# Patient Record
Sex: Male | Born: 1940
Health system: Southern US, Community
[De-identification: ages and names within clinical notes are randomized; demographics above are authoritative.]

## PROBLEM LIST (undated history)

## (undated) DIAGNOSIS — I10 Essential (primary) hypertension: Secondary | ICD-10-CM

## (undated) DIAGNOSIS — J449 Chronic obstructive pulmonary disease, unspecified: Secondary | ICD-10-CM

## (undated) DIAGNOSIS — E785 Hyperlipidemia, unspecified: Secondary | ICD-10-CM

## (undated) HISTORY — DX: Essential (primary) hypertension: I10

## (undated) HISTORY — DX: Chronic obstructive pulmonary disease, unspecified: J44.9

## (undated) HISTORY — DX: Hyperlipidemia, unspecified: E78.5

---

## 2013-01-12 ENCOUNTER — Other Ambulatory Visit: Payer: Self-pay | Admitting: Nurse Practitioner

## 2013-01-12 ENCOUNTER — Other Ambulatory Visit: Payer: Self-pay | Admitting: *Deleted

## 2013-01-12 MED ORDER — FLUTICASONE-SALMETEROL 100-50 MCG/DOSE IN AEPB: 1.0000 | INHALATION_SPRAY | Freq: Two times a day (BID) | RESPIRATORY_TRACT | Status: DC

## 2013-07-04 ENCOUNTER — Other Ambulatory Visit: Payer: Self-pay | Admitting: Nurse Practitioner

## 2013-07-06 NOTE — Telephone Encounter (Signed)
Last seen 06/10/12  DFS

## 2013-07-06 NOTE — Telephone Encounter (Signed)
This patient needs to make an appointment to come in and be seen as it has been over a year since he was here. You may refill the prescription x1

## 2013-08-15 ENCOUNTER — Encounter (INDEPENDENT_AMBULATORY_CARE_PROVIDER_SITE_OTHER): Payer: Self-pay

## 2013-08-15 ENCOUNTER — Ambulatory Visit (INDEPENDENT_AMBULATORY_CARE_PROVIDER_SITE_OTHER): Payer: Medicare Other

## 2013-08-15 DIAGNOSIS — Z23 Encounter for immunization: Secondary | ICD-10-CM

## 2013-08-22 ENCOUNTER — Other Ambulatory Visit: Payer: Self-pay | Admitting: Family Medicine

## 2013-08-23 ENCOUNTER — Ambulatory Visit: Payer: Self-pay

## 2013-08-24 NOTE — Telephone Encounter (Signed)
NTBS.

## 2013-08-24 NOTE — Telephone Encounter (Signed)
LAST OV 8/13. NTBS

## 2013-09-22 ENCOUNTER — Other Ambulatory Visit: Payer: Self-pay | Admitting: Nurse Practitioner

## 2013-11-16 ENCOUNTER — Other Ambulatory Visit: Payer: Self-pay | Admitting: Family Medicine

## 2013-11-23 ENCOUNTER — Other Ambulatory Visit: Payer: Self-pay | Admitting: Family Medicine

## 2013-11-25 NOTE — Telephone Encounter (Signed)
Patient notified at last refill that NTBS. Please advise 

## 2013-12-05 ENCOUNTER — Other Ambulatory Visit: Payer: Self-pay | Admitting: Nurse Practitioner

## 2014-01-06 ENCOUNTER — Telehealth: Payer: Self-pay | Admitting: Family Medicine

## 2014-01-06 NOTE — Telephone Encounter (Signed)
appt given for Monday with Neil Smith 

## 2014-01-09 ENCOUNTER — Encounter: Payer: Self-pay | Admitting: Nurse Practitioner

## 2014-01-09 ENCOUNTER — Ambulatory Visit (INDEPENDENT_AMBULATORY_CARE_PROVIDER_SITE_OTHER): Payer: Medicare HMO

## 2014-01-09 ENCOUNTER — Ambulatory Visit (INDEPENDENT_AMBULATORY_CARE_PROVIDER_SITE_OTHER): Payer: Medicare HMO | Admitting: Nurse Practitioner

## 2014-01-09 VITALS — BP 182/91 | HR 98 | Temp 96.3°F | Ht 72.0 in | Wt 251.2 lb

## 2014-01-09 DIAGNOSIS — Z125 Encounter for screening for malignant neoplasm of prostate: Secondary | ICD-10-CM

## 2014-01-09 DIAGNOSIS — J449 Chronic obstructive pulmonary disease, unspecified: Secondary | ICD-10-CM

## 2014-01-09 DIAGNOSIS — IMO0001 Reserved for inherently not codable concepts without codable children: Secondary | ICD-10-CM

## 2014-01-09 DIAGNOSIS — R0602 Shortness of breath: Secondary | ICD-10-CM

## 2014-01-09 DIAGNOSIS — I10 Essential (primary) hypertension: Secondary | ICD-10-CM | POA: Insufficient documentation

## 2014-01-09 MED ORDER — FLUTICASONE-SALMETEROL 100-50 MCG/DOSE IN AEPB: INHALATION_SPRAY | RESPIRATORY_TRACT | Status: DC

## 2014-01-09 MED ORDER — LISINOPRIL 40 MG PO TABS: 40.0000 mg | ORAL_TABLET | Freq: Every day | ORAL | Status: DC

## 2014-01-09 NOTE — Patient Instructions (Signed)

## 2014-01-09 NOTE — Progress Notes (Signed)
Subjective:    Patient ID: Neil Smith, male    DOB: December 09, 1940, 73 y.o.   MRN: [id number]  HPI  Patient in today with history of COPD- He has not been seen in over a year for follow up. Only medicine he is currently on is advair. He has been getting refills- He is doing quite well- Has not has any recent exacerbations. His blood pressure is elevated today but he is not on any meds for it.    Review of Systems  Constitutional: Negative.   Respiratory: Positive for shortness of breath and wheezing.   Cardiovascular: Negative.   Genitourinary: Negative.   Neurological: Negative.  Negative for dizziness, light-headedness and headaches.  Hematological: Negative.   Psychiatric/Behavioral: Negative.   All other systems reviewed and are negative.       Objective:   Physical Exam  Constitutional: He is oriented to person, place, and time. He appears well-developed and well-nourished.  HENT:  Head: Normocephalic.  Right Ear: External ear normal.  Left Ear: External ear normal.  Nose: Nose normal.  Mouth/Throat: Oropharynx is clear and moist.  Eyes: EOM are normal. Pupils are equal, round, and reactive to light.  Neck: Normal range of motion. Neck supple. No JVD present. No thyromegaly present.  Cardiovascular: Normal rate, regular rhythm, normal heart sounds and intact distal pulses.  Exam reveals no gallop and no friction rub.   No murmur heard. Pulmonary/Chest: Effort normal and breath sounds normal. No respiratory distress. He has no wheezes. He has no rales. He exhibits no tenderness.  Abdominal: Soft. Bowel sounds are normal. He exhibits no mass. There is no tenderness.  Musculoskeletal: Normal range of motion. He exhibits no edema.  Lymphadenopathy:    He has no cervical adenopathy.  Neurological: He is alert and oriented to person, place, and time. No cranial nerve deficit.  Skin: Skin is warm and dry.  Psychiatric: He has a normal mood and affect. His behavior is normal.  Judgment and thought content normal.    BP 182/91  Pulse 98  Temp(Src) 96.3 F (35.7 C) (Oral)  Ht 6' (1.829 m)  Wt 251 lb 3.2 oz (113.944 kg)  BMI 34.06 kg/m2  Chest x ray- Chronic bronchitic changes-Preliminary reading by Ronnald Collum, FNP  [facility]  EKG- NSR with BBB-Preliminary reading by Ronnald Collum, FNP  [facility]      Assessment & Plan:   1. COPD bronchitis   2. Hypertension   3. Prostate cancer screening   4. SOB (shortness of breath)    Orders Placed This Encounter  Procedures  . DG Chest 2 View    Standing Status: Future     Number of Occurrences: 1     Standing Expiration Date: 03/11/2015    Order Specific Question:  Reason for Exam (SYMPTOM  OR DIAGNOSIS REQUIRED)    Answer:  former smoker    Order Specific Question:  Preferred imaging location?    Answer:  Internal  . CMP14+EGFR  . NMR, lipoprofile  . PSA, total and free  . EKG 12-Lead  . EKG 12-Lead   Meds ordered this encounter  Medications  . lisinopril (PRINIVIL,ZESTRIL) 40 MG tablet    Sig: Take 1 tablet (40 mg total) by mouth daily.    Dispense:  90 tablet    Refill:  1    Order Specific Question:  Supervising Provider    Answer:  Chipper Herb [[id number]]  . Fluticasone-Salmeterol (ADVAIR DISKUS) 100-50 MCG/DOSE AEPB    Sig: USE 1  PUFF INTO THE LUNG 2 TIMES A DAY    Dispense:  60 each    Refill:  5    NTBS    Order Specific Question:  Supervising Provider    Answer:  Chipper Herb [place] pending Health maintenance reviewed Diet and exercise encouraged Continue all meds Follow up  In 6 months   [place], FNP

## 2014-01-10 LAB — CMP14+EGFR
ALT: 30 IU/L (ref 0–44)
AST: 19 IU/L (ref 0–40)
Albumin/Globulin Ratio: 2.2 (ref 1.1–2.5)
Albumin: 4.8 g/dL (ref 3.5–4.8)
Alkaline Phosphatase: 47 IU/L (ref 39–117)
BUN/Creatinine Ratio: 13 (ref 10–22)
BUN: 12 mg/dL (ref 8–27)
CALCIUM: 9.6 mg/dL (ref 8.6–10.2)
CO2: 22 mmol/L (ref 18–29)
CREATININE: 0.91 mg/dL (ref 0.76–1.27)
Chloride: 101 mmol/L (ref 97–108)
GFR calc Af Amer: 97 mL/min/{1.73_m2} (ref >59)
GFR calc non Af Amer: 84 mL/min/{1.73_m2} (ref >59)
GLOBULIN, TOTAL: 2.2 g/dL (ref 1.5–4.5)
GLUCOSE: 99 mg/dL (ref 65–99)
Potassium: 4.8 mmol/L (ref 3.5–5.2)
SODIUM: 137 mmol/L (ref 134–144)
TOTAL PROTEIN: 7 g/dL (ref 6.0–8.5)
Total Bilirubin: 0.5 mg/dL (ref 0.0–1.2)

## 2014-01-10 LAB — NMR, LIPOPROFILE
Cholesterol: 183 mg/dL (ref <200)
HDL CHOLESTEROL BY NMR: 36 mg/dL — AB (ref >=40)
HDL PARTICLE NUMBER: 30.6 umol/L (ref >=30.5)
LDL Particle Number: 1938 nmol/L — ABNORMAL HIGH (ref <1000)
LDL Size: 19.6 nm — ABNORMAL LOW (ref >20.5)
LDLC SERPL CALC-MCNC: 93 mg/dL (ref <100)
LP-IR Score: 95 — ABNORMAL HIGH (ref <=45)
Small LDL Particle Number: 1601 nmol/L — ABNORMAL HIGH (ref <=527)
Triglycerides by NMR: 268 mg/dL — ABNORMAL HIGH (ref <150)

## 2014-01-10 LAB — PSA, TOTAL AND FREE
PSA, Free Pct: 25.7 %
PSA, Free: 0.18 ng/mL
PSA: 0.7 ng/mL (ref 0.0–4.0)

## 2014-01-11 ENCOUNTER — Other Ambulatory Visit: Payer: Self-pay | Admitting: Nurse Practitioner

## 2014-01-11 MED ORDER — ATORVASTATIN CALCIUM 20 MG PO TABS: 20.0000 mg | ORAL_TABLET | Freq: Every day | ORAL | Status: DC

## 2014-01-30 ENCOUNTER — Ambulatory Visit (INDEPENDENT_AMBULATORY_CARE_PROVIDER_SITE_OTHER): Payer: Medicare HMO | Admitting: Nurse Practitioner

## 2014-01-30 VITALS — BP 149/74 | HR 94

## 2014-01-30 DIAGNOSIS — I10 Essential (primary) hypertension: Secondary | ICD-10-CM

## 2014-01-30 NOTE — Progress Notes (Signed)
   Subjective:    Patient ID: Neil Smith, male    DOB: 09-05-1941, 73 y.o.   MRN: [id number]  HPI    Review of Systems     Objective:   Physical Exam        Assessment & Plan:  Nurse only visit to check blood pressure

## 2014-01-30 NOTE — Patient Instructions (Signed)

## 2014-02-27 ENCOUNTER — Encounter: Payer: Self-pay | Admitting: *Deleted

## 2014-07-01 ENCOUNTER — Other Ambulatory Visit: Payer: Self-pay | Admitting: Nurse Practitioner

## 2014-07-03 ENCOUNTER — Other Ambulatory Visit: Payer: Self-pay | Admitting: Nurse Practitioner

## 2014-07-13 ENCOUNTER — Ambulatory Visit: Payer: Medicare HMO | Admitting: Nurse Practitioner

## 2014-07-14 ENCOUNTER — Ambulatory Visit: Payer: Medicare HMO | Admitting: Nurse Practitioner

## 2014-07-18 ENCOUNTER — Ambulatory Visit (INDEPENDENT_AMBULATORY_CARE_PROVIDER_SITE_OTHER): Payer: Medicare HMO | Admitting: Nurse Practitioner

## 2014-07-18 ENCOUNTER — Encounter: Payer: Self-pay | Admitting: Nurse Practitioner

## 2014-07-18 VITALS — BP 163/73 | HR 81 | Temp 97.8°F | Ht 72.0 in | Wt 250.4 lb

## 2014-07-18 DIAGNOSIS — E785 Hyperlipidemia, unspecified: Secondary | ICD-10-CM

## 2014-07-18 DIAGNOSIS — I10 Essential (primary) hypertension: Secondary | ICD-10-CM

## 2014-07-18 DIAGNOSIS — G629 Polyneuropathy, unspecified: Secondary | ICD-10-CM | POA: Insufficient documentation

## 2014-07-18 DIAGNOSIS — IMO0001 Reserved for inherently not codable concepts without codable children: Secondary | ICD-10-CM

## 2014-07-18 DIAGNOSIS — G589 Mononeuropathy, unspecified: Secondary | ICD-10-CM

## 2014-07-18 DIAGNOSIS — J449 Chronic obstructive pulmonary disease, unspecified: Secondary | ICD-10-CM

## 2014-07-18 MED ORDER — LISINOPRIL 40 MG PO TABS: ORAL_TABLET | ORAL | Status: DC

## 2014-07-18 MED ORDER — GABAPENTIN 300 MG PO CAPS: 300.0000 mg | ORAL_CAPSULE | Freq: Every day | ORAL | Status: DC

## 2014-07-18 NOTE — Progress Notes (Signed)
   Subjective:    Patient ID: Neil Smith, male    DOB: 04-15-1941, 73 y.o.   MRN: [id number]  Patient is here for chronic disease follow up.   Hypertension This is a chronic problem. The current episode started more than 1 year ago. The problem has been gradually improving since onset. The problem is controlled. Associated symptoms include shortness of breath. Risk factors for coronary artery disease include male gender and obesity. The current treatment provides mild improvement. There are no compliance problems.   Hyperlipidemia This is a chronic problem. The problem is controlled. Factors aggravating his hyperlipidemia include thiazides. Associated symptoms include shortness of breath. Current antihyperlipidemic treatment includes statins. The current treatment provides mild improvement of lipids. There are no compliance problems.  Risk factors for coronary artery disease include hypertension and male sex.   COPD:  Currently on advair and reported he is doing well.   *Patient complaints of burning on the sole of his feet during the night. Microfilament test was done and was positive on all location.   Review of Systems  Constitutional: Negative.   HENT: Negative.   Eyes: Negative.   Respiratory: Positive for shortness of breath.   Cardiovascular: Negative.   Gastrointestinal: Negative.   Endocrine: Negative.   Genitourinary: Negative.   Musculoskeletal: Negative.        C/O burning on the sole of his feet at night.   Skin: Negative.   Allergic/Immunologic: Negative.   Neurological: Negative.   Hematological: Negative.   Psychiatric/Behavioral: Negative.        Objective:   Physical Exam  Constitutional: He is oriented to person, place, and time. He appears well-developed and well-nourished.  HENT:  Head: Normocephalic.  Eyes: Pupils are equal, round, and reactive to light.  Neck: Normal range of motion.  Cardiovascular: Normal rate and regular rhythm.     Pulmonary/Chest: Effort normal and breath sounds normal.  Abdominal: Soft.  Musculoskeletal: Normal range of motion.  Neurological: He is alert and oriented to person, place, and time. He has normal reflexes.  Skin: Skin is warm.  Psychiatric: He has a normal mood and affect. His behavior is normal.   BP 163/73  Pulse 81  Temp(Src) 97.8 F (36.6 C) (Oral)  Ht 6' (1.829 m)  Wt 250 lb 6.4 oz (113.581 kg)  BMI 33.95 kg/m2        Assessment & Plan:   1. Essential hypertension - CMP14+EGFR - lisinopril (PRINIVIL,ZESTRIL) 40 MG tablet; TAKE 1 TABLET (40 MG TOTAL) BY MOUTH DAILY.  Dispense: 90 tablet; Refill: 1  2. COPD bronchitis Currently taking advair.   3. Other and unspecified hyperlipidemia - NMR, lipoprofile  4. Neuropathy  - gabapentin (NEURONTIN) 300 MG capsule; Take 1 capsule (300 mg total) by mouth at bedtime.  Dispense: 90 capsule; Refill: 1    Labs pending Health maintenance reviewed Diet and exercise encouraged Continue all meds Follow up  In 3 months PRN   Mary-Margaret Hassell Done, FNP

## 2014-07-18 NOTE — Patient Instructions (Addendum)
Food Choices to Lower Your Triglycerides  Triglycerides are a type of fat in your blood. High levels of triglycerides can increase the risk of heart disease and stroke. If your triglyceride levels are high, the foods you eat and your eating habits are very important. Choosing the right foods can help lower your triglycerides.  WHAT GENERAL GUIDELINES DO I NEED TO FOLLOW?  Lose weight if you are overweight.   Limit or avoid alcohol.   Fill one half of your plate with vegetables and green salads.   Limit fruit to two servings a day. Choose fruit instead of juice.   Make one fourth of your plate whole grains. Look for the word "whole" as the first word in the ingredient list.  Fill one fourth of your plate with lean protein foods.  Enjoy fatty fish (such as salmon, mackerel, sardines, and tuna) three times a week.   Choose healthy fats.   Limit foods high in starch and sugar.  Eat more home-cooked food and less restaurant, buffet, and fast food.  Limit fried foods.  Cook foods using methods other than frying.  Limit saturated fats.  Check ingredient lists to avoid foods with partially hydrogenated oils (trans fats) in them. WHAT FOODS CAN I EAT?  Grains Whole grains, such as whole wheat or whole grain breads, crackers, cereals, and pasta. Unsweetened oatmeal, bulgur, barley, quinoa, or brown rice. Corn or whole wheat flour tortillas.  Vegetables Fresh or frozen vegetables (raw, steamed, roasted, or grilled). Green salads. Fruits All fresh, canned (in natural juice), or frozen fruits. Meat and Other Protein Products Ground beef (85% or leaner), grass-fed beef, or beef trimmed of fat. Skinless chicken or turkey. Ground chicken or turkey. Pork trimmed of fat. All fish and seafood. Eggs. Dried beans, peas, or lentils. Unsalted nuts or seeds. Unsalted canned or dry beans. Dairy Low-fat dairy products, such as skim or 1% milk, 2% or reduced-fat cheeses, low-fat ricotta or  cottage cheese, or plain low-fat yogurt. Fats and Oils Tub margarines without trans fats. Light or reduced-fat mayonnaise and salad dressings. Avocado. Safflower, olive, or canola oils. Natural peanut or almond butter. The items listed above may not be a complete list of recommended foods or beverages. Contact your dietitian for more options. WHAT FOODS ARE NOT RECOMMENDED?  Grains White bread. White pasta. White rice. Cornbread. Bagels, pastries, and croissants. Crackers that contain trans fat. Vegetables White potatoes. Corn. Creamed or fried vegetables. Vegetables in a cheese sauce. Fruits Dried fruits. Canned fruit in light or heavy syrup. Fruit juice. Meat and Other Protein Products Fatty cuts of meat. Ribs, chicken wings, bacon, sausage, bologna, salami, chitterlings, fatback, hot dogs, bratwurst, and packaged luncheon meats. Dairy Whole or 2% milk, cream, half-and-half, and cream cheese. Whole-fat or sweetened yogurt. Full-fat cheeses. Nondairy creamers and whipped toppings. Processed cheese, cheese spreads, or cheese curds. Sweets and Desserts Corn syrup, sugars, honey, and molasses. Candy. Jam and jelly. Syrup. Sweetened cereals. Cookies, pies, cakes, donuts, muffins, and ice cream. Fats and Oils Butter, stick margarine, lard, shortening, ghee, or bacon fat. Coconut, palm kernel, or palm oils. Beverages Alcohol. Sweetened drinks (such as sodas, lemonade, and fruit drinks or punches). The items listed above may not be a complete list of foods and beverages to avoid. Contact your dietitian for more information. Document Released: 07/31/2004 Document Revised: 10/18/2013 Document Reviewed: 08/17/2013 [employer] Patient Information 2015 [employer]. This information is not intended to replace advice given to you by your health care provider. Make sure you discuss   any questions you have with your health care provider. Peripheral Neuropathy Peripheral neuropathy is a type of nerve  damage. It affects nerves that carry signals between the spinal cord and other parts of the body. These are called peripheral nerves. With peripheral neuropathy, one nerve or a group of nerves may be damaged.  CAUSES  Many things can damage peripheral nerves. For some people with peripheral neuropathy, the cause is unknown. Some causes include:  Diabetes. This is the most common cause of peripheral neuropathy.  Injury to a nerve.  Pressure or stress on a nerve that lasts a long time.  Too little vitamin B. Alcoholism can lead to this.  Infections.  Autoimmune diseases, such as multiple sclerosis and systemic lupus erythematosus.  Inherited nerve diseases.  Some medicines, such as cancer drugs.  Toxic substances, such as lead and mercury.  Too little blood flowing to the legs.  Kidney disease.  Thyroid disease. SIGNS AND SYMPTOMS  Different people have different symptoms. The symptoms you have will depend on which of your nerves is damaged. Common symptoms include:  Loss of feeling (numbness) in the feet and hands.  Tingling in the feet and hands.  Pain that burns.  Very sensitive skin.  Weakness.  Not being able to move a part of the body (paralysis).  Muscle twitching.  Clumsiness or poor coordination.  Loss of balance.  Not being able to control your bladder.  Feeling dizzy.  Sexual problems. DIAGNOSIS  Peripheral neuropathy is a symptom, not a disease. Finding the cause of peripheral neuropathy can be hard. To figure that out, your health care provider will take a medical history and do a physical exam. A neurological exam will also be done. This involves checking things affected by your brain, spinal cord, and nerves (nervous system). For example, your health care provider will check your reflexes, how you move, and what you can feel.  Other types of tests may also be ordered, such as:  Blood tests.  A test of the fluid in your spinal cord.  Imaging  tests, such as CT scans or an MRI.  Electromyography (EMG). This test checks the nerves that control muscles.  Nerve conduction velocity tests. These tests check how fast messages pass through your nerves.  Nerve biopsy. A small piece of nerve is removed. It is then checked under a microscope. TREATMENT   Medicine is often used to treat peripheral neuropathy. Medicines may include:  Pain-relieving medicines. Prescription or over-the-counter medicine may be suggested.  Antiseizure medicine. This may be used for pain.  Antidepressants. These also may help ease pain from neuropathy.  Lidocaine. This is a numbing medicine. You might wear a patch or be given a shot.  Mexiletine. This medicine is typically used to help control irregular heart rhythms.  Surgery. Surgery may be needed to relieve pressure on a nerve or to destroy a nerve that is causing pain.  Physical therapy to help movement.  Assistive devices to help movement. HOME CARE INSTRUCTIONS   Only take over-the-counter or prescription medicines as directed by your health care provider. Follow the instructions carefully for any given medicines. Do not take any other medicines without first getting approval from your health care provider.  If you have diabetes, work closely with your health care provider to keep your blood sugar under control.  If you have numbness in your feet:  Check every day for signs of injury or infection. Watch for redness, warmth, and swelling.  Wear padded socks and comfortable  shoes. These help protect your feet.  Do not do things that put pressure on your damaged nerve.  Do not smoke. Smoking keeps blood from getting to damaged nerves.  Avoid or limit alcohol. Too much alcohol can cause a lack of B vitamins. These vitamins are needed for healthy nerves.  Develop a good support system. Coping with peripheral neuropathy can be stressful. Talk to a mental health specialist or join a support group  if you are struggling.  Follow up with your health care provider as directed. SEEK MEDICAL CARE IF:   You have new signs or symptoms of peripheral neuropathy.  You are struggling emotionally from dealing with peripheral neuropathy.  You have a fever. SEEK IMMEDIATE MEDICAL CARE IF:   You have an injury or infection that is not healing.  You feel very dizzy or begin vomiting.  You have chest pain.  You have trouble breathing. Document Released: 10/03/2002 Document Revised: 06/25/2011 Document Reviewed: 06/20/2013 [employer] Patient Information 2015 [place], [place]. This information is not intended to replace advice given to you by your health care provider. Make sure you discuss any questions you have with your health care provider.

## 2014-07-19 ENCOUNTER — Other Ambulatory Visit: Payer: Medicare HMO

## 2014-07-19 ENCOUNTER — Other Ambulatory Visit: Payer: Self-pay | Admitting: Family Medicine

## 2014-07-19 DIAGNOSIS — I10 Essential (primary) hypertension: Secondary | ICD-10-CM

## 2014-07-19 DIAGNOSIS — Z1212 Encounter for screening for malignant neoplasm of rectum: Secondary | ICD-10-CM

## 2014-07-19 LAB — NMR, LIPOPROFILE
CHOLESTEROL: 125 mg/dL (ref 100–199)
HDL Cholesterol by NMR: 27 mg/dL — ABNORMAL LOW (ref >39)
HDL Particle Number: 27.2 umol/L — ABNORMAL LOW (ref >=30.5)
LDL Particle Number: 977 nmol/L (ref <1000)
LDL Size: 19.7 nm (ref >20.5)
LDLC SERPL CALC-MCNC: 60 mg/dL (ref 0–99)
LP-IR Score: 81 — ABNORMAL HIGH (ref <=45)
Small LDL Particle Number: 753 nmol/L — ABNORMAL HIGH (ref <=527)
TRIGLYCERIDES BY NMR: 192 mg/dL — AB (ref 0–149)

## 2014-07-19 LAB — CMP14+EGFR
ALBUMIN: 4.4 g/dL (ref 3.5–4.8)
ALT: 20 IU/L (ref 0–44)
AST: 14 IU/L (ref 0–40)
Albumin/Globulin Ratio: 1.8 (ref 1.1–2.5)
Alkaline Phosphatase: 59 IU/L (ref 39–117)
BUN/Creatinine Ratio: 9 — ABNORMAL LOW (ref 10–22)
BUN: 11 mg/dL (ref 8–27)
CHLORIDE: 103 mmol/L (ref 97–108)
CO2: 23 mmol/L (ref 18–29)
Calcium: 9.6 mg/dL (ref 8.6–10.2)
Creatinine, Ser: 1.21 mg/dL (ref 0.76–1.27)
GFR calc Af Amer: 68 mL/min/{1.73_m2} (ref >59)
GFR calc non Af Amer: 59 mL/min/{1.73_m2} — ABNORMAL LOW (ref >59)
GLUCOSE: 99 mg/dL (ref 65–99)
Globulin, Total: 2.4 g/dL (ref 1.5–4.5)
Potassium: 4.4 mmol/L (ref 3.5–5.2)
Sodium: 141 mmol/L (ref 134–144)
Total Bilirubin: 0.4 mg/dL (ref 0.0–1.2)
Total Protein: 6.8 g/dL (ref 6.0–8.5)

## 2014-07-19 MED ORDER — LISINOPRIL 40 MG PO TABS: ORAL_TABLET | ORAL | Status: DC

## 2014-07-21 LAB — FECAL OCCULT BLOOD, IMMUNOCHEMICAL: FECAL OCCULT BLD: NEGATIVE

## 2014-07-24 ENCOUNTER — Telehealth: Payer: Self-pay | Admitting: Family Medicine

## 2014-07-24 NOTE — Telephone Encounter (Signed)
Message copied by Azalee Course on Mon Jul 24, 2014 10:06 AM ------      Message from: Bennie Pierini      Created: Fri Jul 21, 2014  3:41 PM       hemocult negative ------

## 2014-07-24 NOTE — Telephone Encounter (Signed)
Patient aware.

## 2014-07-30 ENCOUNTER — Other Ambulatory Visit: Payer: Self-pay | Admitting: Nurse Practitioner

## 2014-09-28 ENCOUNTER — Other Ambulatory Visit: Payer: Self-pay | Admitting: Nurse Practitioner

## 2014-10-23 ENCOUNTER — Ambulatory Visit: Payer: Medicare HMO | Admitting: Nurse Practitioner

## 2014-12-23 ENCOUNTER — Other Ambulatory Visit: Payer: Self-pay | Admitting: Nurse Practitioner

## 2014-12-25 NOTE — Telephone Encounter (Signed)
Gave 1mos refill to make appt for March

## 2014-12-30 ENCOUNTER — Other Ambulatory Visit: Payer: Self-pay | Admitting: Nurse Practitioner

## 2015-01-16 ENCOUNTER — Other Ambulatory Visit: Payer: Self-pay | Admitting: Nurse Practitioner

## 2015-03-23 ENCOUNTER — Other Ambulatory Visit: Payer: Self-pay | Admitting: Nurse Practitioner

## 2015-03-23 NOTE — Telephone Encounter (Signed)
Last seen 07/18/14 MMM  Requesting 90 day supply

## 2015-03-24 NOTE — Telephone Encounter (Signed)
no more refills without being seen  

## 2015-03-29 ENCOUNTER — Other Ambulatory Visit: Payer: Self-pay | Admitting: Nurse Practitioner

## 2015-03-29 NOTE — Telephone Encounter (Signed)
no more refills without being seen  

## 2015-04-01 ENCOUNTER — Other Ambulatory Visit: Payer: Self-pay | Admitting: Nurse Practitioner

## 2015-05-01 ENCOUNTER — Telehealth: Payer: Self-pay | Admitting: Nurse Practitioner

## 2015-05-01 ENCOUNTER — Other Ambulatory Visit: Payer: Self-pay | Admitting: Nurse Practitioner

## 2015-05-01 MED ORDER — ATORVASTATIN CALCIUM 20 MG PO TABS: 20.0000 mg | ORAL_TABLET | Freq: Every day | ORAL | Status: DC

## 2015-05-01 NOTE — Telephone Encounter (Signed)
Med was written for 30 days because patient needs to be seen. Request today was denied for this reason.  Spoke with daughter in law and appt scheduled for 05/09/15. He only has 4 pills left and will need a script to last until then. Med reordered electronically.

## 2015-05-09 ENCOUNTER — Ambulatory Visit (INDEPENDENT_AMBULATORY_CARE_PROVIDER_SITE_OTHER): Payer: Commercial Managed Care - HMO | Admitting: Nurse Practitioner

## 2015-05-09 ENCOUNTER — Encounter: Payer: Self-pay | Admitting: Nurse Practitioner

## 2015-05-09 VITALS — BP 145/63 | HR 99 | Temp 97.1°F | Ht 72.0 in | Wt 251.8 lb

## 2015-05-09 DIAGNOSIS — I10 Essential (primary) hypertension: Secondary | ICD-10-CM | POA: Diagnosis not present

## 2015-05-09 DIAGNOSIS — J449 Chronic obstructive pulmonary disease, unspecified: Secondary | ICD-10-CM

## 2015-05-09 DIAGNOSIS — IMO0001 Reserved for inherently not codable concepts without codable children: Secondary | ICD-10-CM

## 2015-05-09 DIAGNOSIS — Z125 Encounter for screening for malignant neoplasm of prostate: Secondary | ICD-10-CM | POA: Diagnosis not present

## 2015-05-09 DIAGNOSIS — G629 Polyneuropathy, unspecified: Secondary | ICD-10-CM

## 2015-05-09 DIAGNOSIS — Z23 Encounter for immunization: Secondary | ICD-10-CM | POA: Diagnosis not present

## 2015-05-09 DIAGNOSIS — R600 Localized edema: Secondary | ICD-10-CM

## 2015-05-09 DIAGNOSIS — R609 Edema, unspecified: Secondary | ICD-10-CM

## 2015-05-09 DIAGNOSIS — E785 Hyperlipidemia, unspecified: Secondary | ICD-10-CM

## 2015-05-09 DIAGNOSIS — Z6834 Body mass index (BMI) 34.0-34.9, adult: Secondary | ICD-10-CM | POA: Insufficient documentation

## 2015-05-09 MED ORDER — FUROSEMIDE 20 MG PO TABS: 20.0000 mg | ORAL_TABLET | Freq: Every day | ORAL | Status: DC

## 2015-05-09 MED ORDER — LISINOPRIL 40 MG PO TABS: ORAL_TABLET | ORAL | Status: DC

## 2015-05-09 MED ORDER — FLUTICASONE-SALMETEROL 100-50 MCG/DOSE IN AEPB: INHALATION_SPRAY | RESPIRATORY_TRACT | Status: DC

## 2015-05-09 MED ORDER — ATORVASTATIN CALCIUM 20 MG PO TABS: 20.0000 mg | ORAL_TABLET | Freq: Every day | ORAL | Status: DC

## 2015-05-09 MED ORDER — GABAPENTIN 300 MG PO CAPS: ORAL_CAPSULE | ORAL | Status: DC

## 2015-05-09 NOTE — Progress Notes (Signed)
Subjective:    Patient ID: Neil Smith, male    DOB: Aug 12, 1941, 74 y.o.   MRN: [id number]  Patient is here for chronic disease follow up.   Hypertension This is a chronic problem. The current episode started more than 1 year ago. The problem has been gradually improving since onset. The problem is controlled. Associated symptoms include shortness of breath. Risk factors for coronary artery disease include male gender and obesity. Past treatments include ACE inhibitors. The current treatment provides mild improvement. There are no compliance problems.  There is no history of CAD/MI, CVA or retinopathy.  Hyperlipidemia This is a chronic problem. The problem is controlled. Factors aggravating his hyperlipidemia include thiazides. Associated symptoms include shortness of breath. Current antihyperlipidemic treatment includes statins. The current treatment provides mild improvement of lipids. There are no compliance problems.  Risk factors for coronary artery disease include hypertension and male sex.   COPD:  Currently on advair and reported he is doing well.   *Patient complaints of burning on the sole of his feet during the night. Microfilament test was done and was positive on all location.   Review of Systems  Constitutional: Negative.   HENT: Negative.   Eyes: Negative.   Respiratory: Positive for shortness of breath.   Cardiovascular: Negative.   Gastrointestinal: Negative.   Endocrine: Negative.   Genitourinary: Negative.   Musculoskeletal: Negative.        C/O burning on the sole of his feet at night.   Skin: Negative.   Allergic/Immunologic: Negative.   Neurological: Negative.   Hematological: Negative.   Psychiatric/Behavioral: Negative.        Objective:   Physical Exam  Constitutional: He is oriented to person, place, and time. He appears well-developed and well-nourished.  HENT:  Head: Normocephalic.  Eyes: Pupils are equal, round, and reactive to light.  Neck:  Normal range of motion.  Cardiovascular: Normal rate and regular rhythm.   Pulmonary/Chest: Effort normal and breath sounds normal.  Abdominal: Soft.  Musculoskeletal: Normal range of motion. He exhibits edema (1+ edema bil lower ext).  Neurological: He is alert and oriented to person, place, and time. He has normal reflexes.  Skin: Skin is warm.  Psychiatric: He has a normal mood and affect. His behavior is normal.   BP 145/63 mmHg  Pulse 99  Temp(Src) 97.1 F (36.2 C) (Oral)  Ht 6' (1.829 m)  Wt 251 lb 12.8 oz (114.216 kg)  BMI 34.14 kg/m2      Assessment & Plan:   1. Essential hypertension Do not add salt to diet Keep diary of blood sugars at home and call if stays above 275 systolic - lisinopril (PRINIVIL,ZESTRIL) 40 MG tablet; TAKE 1 TABLET (40 MG TOTAL) BY MOUTH DAILY.  Dispense: 90 tablet; Refill: 1 - CMP14+EGFR  2. COPD bronchitis - Fluticasone-Salmeterol (ADVAIR DISKUS) 100-50 MCG/DOSE AEPB; USE 1 PUFF TWICE A DAY  Dispense: 60 each; Refill: 5  3. Neuropathy - gabapentin (NEURONTIN) 300 MG capsule; TAKE 1 CAPSULE (300 MG TOTAL) BY MOUTH AT BEDTIME.  Dispense: 90 capsule; Refill: 5  4. Hyperlipidemia with target LDL less than 100 Low fat diet - atorvastatin (LIPITOR) 20 MG tablet; Take 1 tablet (20 mg total) by mouth daily at 6 PM.  Dispense: 30 tablet; Refill: 5 - Lipid panel  5. BMI 34.0-34.9,adult Discussed diet and exercise for person with BMI >25 Will recheck weight in 3-6 months   6. Peripheral edema Elevate legs when sitting - furosemide (LASIX) 20 MG tablet; Take  1 tablet (20 mg total) by mouth daily.  Dispense: 30 tablet; Refill: 5  7. Prostate cancer screening - PSA Total+%Free (Serial)    Labs pending Health maintenance reviewed Diet and exercise encouraged Continue all meds Follow up  In 3 months   [place], FNP

## 2015-05-09 NOTE — Patient Instructions (Signed)

## 2015-05-09 NOTE — Addendum Note (Signed)
Addended by: Fawn KirkHOLT, CATHY on: 05/09/2015 09:22 AM   Modules accepted: Orders

## 2015-05-09 NOTE — Addendum Note (Signed)
Addended by: Tommas OlpHANDY, Hridhaan Yohn N on: 05/09/2015 09:25 AM   Modules accepted: Orders

## 2015-05-10 ENCOUNTER — Telehealth: Payer: Self-pay | Admitting: *Deleted

## 2015-05-10 LAB — CMP14+EGFR
ALT: 23 IU/L (ref 0–44)
AST: 19 IU/L (ref 0–40)
Albumin/Globulin Ratio: 1.8 (ref 1.1–2.5)
Albumin: 4.2 g/dL (ref 3.5–4.8)
Alkaline Phosphatase: 53 IU/L (ref 39–117)
BILIRUBIN TOTAL: 0.7 mg/dL (ref 0.0–1.2)
BUN / CREAT RATIO: 15 (ref 10–22)
BUN: 16 mg/dL (ref 8–27)
CO2: 22 mmol/L (ref 18–29)
Calcium: 9.2 mg/dL (ref 8.6–10.2)
Chloride: 102 mmol/L (ref 97–108)
Creatinine, Ser: 1.07 mg/dL (ref 0.76–1.27)
GFR calc non Af Amer: 68 mL/min/{1.73_m2} (ref >59)
GFR, EST AFRICAN AMERICAN: 79 mL/min/{1.73_m2} (ref >59)
GLOBULIN, TOTAL: 2.3 g/dL (ref 1.5–4.5)
Glucose: 184 mg/dL — ABNORMAL HIGH (ref 65–99)
Potassium: 4.9 mmol/L (ref 3.5–5.2)
Sodium: 141 mmol/L (ref 134–144)
Total Protein: 6.5 g/dL (ref 6.0–8.5)

## 2015-05-10 LAB — LIPID PANEL
CHOL/HDL RATIO: 4.6 ratio (ref 0.0–5.0)
CHOLESTEROL TOTAL: 129 mg/dL (ref 100–199)
HDL: 28 mg/dL — AB (ref >39)
LDL Calculated: 66 mg/dL (ref 0–99)
Triglycerides: 177 mg/dL — ABNORMAL HIGH (ref 0–149)
VLDL CHOLESTEROL CAL: 35 mg/dL (ref 5–40)

## 2015-05-10 LAB — PSA, TOTAL AND FREE
PROSTATE SPECIFIC AG, SERUM: 0.8 ng/mL (ref 0.0–4.0)
PSA, Free Pct: 22.5 %
PSA, Free: 0.18 ng/mL

## 2015-05-10 NOTE — Telephone Encounter (Signed)
-----   [facility] EdMary-Margaret Martin, FNP sent at 05/10/2015  1:39 PM EDT ----- Kidney and liver function stable Blood sugar elevated- don't think he was fasting- add hgba1c just to make sure Cholesterol looks good PSA normal Continue current meds- low fat diet and exercise and recheck in 3 months

## 2015-05-10 NOTE — Telephone Encounter (Signed)
Pt's son notified of results Verbalizes understanding

## 2015-05-21 ENCOUNTER — Other Ambulatory Visit: Payer: Self-pay | Admitting: Nurse Practitioner

## 2015-08-14 ENCOUNTER — Telehealth: Payer: Self-pay | Admitting: Nurse Practitioner

## 2015-09-27 ENCOUNTER — Other Ambulatory Visit: Payer: Self-pay | Admitting: *Deleted

## 2015-09-27 DIAGNOSIS — R609 Edema, unspecified: Secondary | ICD-10-CM

## 2015-09-27 DIAGNOSIS — E785 Hyperlipidemia, unspecified: Secondary | ICD-10-CM

## 2015-09-27 MED ORDER — FUROSEMIDE 20 MG PO TABS: 20.0000 mg | ORAL_TABLET | Freq: Every day | ORAL | Status: DC

## 2015-09-27 MED ORDER — ATORVASTATIN CALCIUM 20 MG PO TABS: 20.0000 mg | ORAL_TABLET | Freq: Every day | ORAL | Status: DC

## 2015-10-14 ENCOUNTER — Other Ambulatory Visit: Payer: Self-pay | Admitting: Nurse Practitioner

## 2015-12-04 ENCOUNTER — Ambulatory Visit (INDEPENDENT_AMBULATORY_CARE_PROVIDER_SITE_OTHER): Payer: Commercial Managed Care - HMO | Admitting: Nurse Practitioner

## 2015-12-04 ENCOUNTER — Encounter: Payer: Self-pay | Admitting: Nurse Practitioner

## 2015-12-04 VITALS — BP 136/82 | HR 100 | Temp 97.1°F | Ht 72.0 in | Wt 248.0 lb

## 2015-12-04 DIAGNOSIS — R609 Edema, unspecified: Secondary | ICD-10-CM | POA: Diagnosis not present

## 2015-12-04 DIAGNOSIS — E785 Hyperlipidemia, unspecified: Secondary | ICD-10-CM | POA: Diagnosis not present

## 2015-12-04 DIAGNOSIS — I10 Essential (primary) hypertension: Secondary | ICD-10-CM

## 2015-12-04 DIAGNOSIS — G629 Polyneuropathy, unspecified: Secondary | ICD-10-CM

## 2015-12-04 DIAGNOSIS — J449 Chronic obstructive pulmonary disease, unspecified: Secondary | ICD-10-CM

## 2015-12-04 DIAGNOSIS — Z0000 Encounter for general adult medical examination without abnormal findings: Secondary | ICD-10-CM | POA: Diagnosis not present

## 2015-12-04 DIAGNOSIS — Z6833 Body mass index (BMI) 33.0-33.9, adult: Secondary | ICD-10-CM

## 2015-12-04 DIAGNOSIS — Z6834 Body mass index (BMI) 34.0-34.9, adult: Secondary | ICD-10-CM

## 2015-12-04 DIAGNOSIS — IMO0001 Reserved for inherently not codable concepts without codable children: Secondary | ICD-10-CM

## 2015-12-04 MED ORDER — GABAPENTIN 300 MG PO CAPS: ORAL_CAPSULE | ORAL | Status: DC

## 2015-12-04 MED ORDER — FLUTICASONE-SALMETEROL 100-50 MCG/DOSE IN AEPB: INHALATION_SPRAY | RESPIRATORY_TRACT | Status: DC

## 2015-12-04 MED ORDER — FUROSEMIDE 20 MG PO TABS: 20.0000 mg | ORAL_TABLET | Freq: Every day | ORAL | Status: DC

## 2015-12-04 MED ORDER — ATORVASTATIN CALCIUM 20 MG PO TABS: ORAL_TABLET | ORAL | Status: DC

## 2015-12-04 MED ORDER — LISINOPRIL 40 MG PO TABS: ORAL_TABLET | ORAL | Status: DC

## 2015-12-04 NOTE — Progress Notes (Signed)
Subjective:    Patient ID: Neil Smith, male    DOB: 02-06-1941, 75 y.o.   MRN: [id number]  Patient here today for follow up of chronic medical problems.  Outpatient Encounter Prescriptions as of 12/04/2015  Medication Sig  . ADVAIR DISKUS 100-50 MCG/DOSE AEPB USE 1 PUFF TWICE A DAY  . atorvastatin (LIPITOR) 20 MG tablet TAKE 1 TABLET (20 MG TOTAL) BY MOUTH DAILY AT 6 PM.  . Fluticasone-Salmeterol (ADVAIR DISKUS) 100-50 MCG/DOSE AEPB USE 1 PUFF TWICE A DAY  . furosemide (LASIX) 20 MG tablet Take 1 tablet (20 mg total) by mouth daily.  Marland Kitchen gabapentin (NEURONTIN) 300 MG capsule TAKE 1 CAPSULE (300 MG TOTAL) BY MOUTH AT BEDTIME.  Marland Kitchen lisinopril (PRINIVIL,ZESTRIL) 40 MG tablet TAKE 1 TABLET (40 MG TOTAL) BY MOUTH DAILY.  . Magnesium 400 MG CAPS Take by mouth.  . VENTOLIN HFA 108 (90 BASE) MCG/ACT inhaler INHALE 2 PUFFS EVERY 4 TO 6 HOURS AS NEEDED   No facility-administered encounter medications on file as of 12/04/2015.    Hypertension This is a chronic problem. The current episode started more than 1 year ago. The problem has been gradually improving since onset. The problem is controlled. Associated symptoms include shortness of breath. Risk factors for coronary artery disease include male gender and obesity. Past treatments include ACE inhibitors. The current treatment provides mild improvement. There are no compliance problems.  There is no history of CAD/MI, CVA or retinopathy.  Hyperlipidemia This is a chronic problem. The problem is controlled. Factors aggravating his hyperlipidemia include thiazides. Associated symptoms include shortness of breath. Current antihyperlipidemic treatment includes statins. The current treatment provides mild improvement of lipids. There are no compliance problems.  Risk factors for coronary artery disease include hypertension and male sex.  COPD:  Currently on advair and reported he is doing well.  neuropathy neurotin at bedtime- helps with burning  sensation in feet       Review of Systems  Constitutional: Negative.   HENT: Negative.   Eyes: Negative.   Respiratory: Positive for shortness of breath.   Cardiovascular: Negative.   Gastrointestinal: Negative.   Endocrine: Negative.   Genitourinary: Negative.   Musculoskeletal: Negative.        C/O burning on the sole of his feet at night.   Skin: Negative.   Allergic/Immunologic: Negative.   Neurological: Negative.   Hematological: Negative.   Psychiatric/Behavioral: Negative.        Objective:   Physical Exam  Constitutional: He is oriented to person, place, and time. He appears well-developed and well-nourished.  HENT:  Head: Normocephalic.  Eyes: Pupils are equal, round, and reactive to light.  Neck: Normal range of motion.  Cardiovascular: Normal rate and regular rhythm.   Pulmonary/Chest: Effort normal and breath sounds normal.  Abdominal: Soft.  Musculoskeletal: Normal range of motion. He exhibits edema (1+ edema bil lower ext).  Neurological: He is alert and oriented to person, place, and time. He has normal reflexes.  Skin: Skin is warm.  Psychiatric: He has a normal mood and affect. His behavior is normal.   BP 136/82 mmHg  Pulse 100  Temp(Src) 97.1 F (36.2 C) (Oral)  Ht 6' (1.829 m)  Wt 248 lb (112.492 kg)  BMI 33.63 kg/m2       Assessment & Plan:  1. Annual physical exam - Anemia Profile B  2. Essential hypertension Do  not add salt to diet - lisinopril (PRINIVIL,ZESTRIL) 40 MG tablet; TAKE 1 TABLET (40 MG TOTAL) BY MOUTH  DAILY.  Dispense: 30 tablet; Refill: 5 - CMP14+EGFR  3. Hyperlipidemia with target LDL less than 100 Low fat diet - atorvastatin (LIPITOR) 20 MG tablet; TAKE 1 TABLET (20 MG TOTAL) BY MOUTH DAILY AT 6 PM.  Dispense: 30 tablet; Refill: 5 - Lipid panel  4. BMI 34.0-34.9,adult Discussed diet and exercise for person with BMI >25 Will recheck weight in 3-6 months  5. Neuropathy (HCC) - gabapentin (NEURONTIN) 300 MG  capsule; TAKE 1 CAPSULE (300 MG TOTAL) BY MOUTH AT BEDTIME.  Dispense: 90 capsule; Refill: 5  6. COPD bronchitis - Fluticasone-Salmeterol (ADVAIR DISKUS) 100-50 MCG/DOSE AEPB; USE 1 PUFF TWICE A DAY  Dispense: 60 each; Refill: 5  7. BMI 33.0-33.9,adult Discussed diet and exercise for person with BMI >25 Will recheck weight in 3-6 months   8. Peripheral edema Elevate your feet when sitting - furosemide (LASIX) 20 MG tablet; Take 1 tablet (20 mg total) by mouth daily.  Dispense: 90 tablet; Refill: 0    Labs pending Health maintenance reviewed Diet and exercise encouraged Continue all meds Follow up  In 6 month   [place], FNP

## 2015-12-04 NOTE — Addendum Note (Signed)
Addended by: Prescott Gum on: 12/04/2015 03:37 PM   Modules accepted: Kipp Brood

## 2015-12-04 NOTE — Patient Instructions (Signed)
Exercising to Stay Healthy Exercising regularly is important. It has many health benefits, such as:  Improving your overall fitness, flexibility, and endurance.  Increasing your bone density.  Helping with weight control.  Decreasing your body fat.  Increasing your muscle strength.  Reducing stress and tension.  Improving your overall health. In order to become healthy and stay healthy, it is recommended that you do moderate-intensity and vigorous-intensity exercise. You can tell that you are exercising at a moderate intensity if you have a higher heart rate and faster breathing, but you are still able to hold a conversation. You can tell that you are exercising at a vigorous intensity if you are breathing much harder and faster and cannot hold a conversation while exercising. HOW OFTEN SHOULD I EXERCISE? Choose an activity that you enjoy and set realistic goals. Your health care provider can help you to make an activity plan that works for you. Exercise regularly as directed by your health care provider. This may include:   Doing resistance training twice each week, such as:  Push-ups.  Sit-ups.  Lifting weights.  Using resistance bands.  Doing a given intensity of exercise for a given amount of time. Choose from these options:  150 minutes of moderate-intensity exercise every week.  75 minutes of vigorous-intensity exercise every week.  A mix of moderate-intensity and vigorous-intensity exercise every week. Children, pregnant women, people who are out of shape, people who are overweight, and older adults may need to consult a health care provider for individual recommendations. If you have any sort of medical condition, be sure to consult your health care provider before starting a new exercise program.  WHAT ARE SOME EXERCISE IDEAS? Some moderate-intensity exercise ideas include:   Walking at a rate of 1 mile in 15  minutes.  Biking.  Hiking.  Golfing.  Dancing. Some vigorous-intensity exercise ideas include:   Walking at a rate of at least 4.5 miles per hour.  Jogging or running at a rate of 5 miles per hour.  Biking at a rate of at least 10 miles per hour.  Lap swimming.  Roller-skating or in-line skating.  Cross-country skiing.  Vigorous competitive sports, such as football, basketball, and soccer.  Jumping rope.  Aerobic dancing. WHAT ARE SOME EVERYDAY ACTIVITIES THAT CAN HELP ME TO GET EXERCISE?  Yard work, such as:  Pushing a lawn mower.  Raking and bagging leaves.  Washing and waxing your car.  Pushing a stroller.  Shoveling snow.  Gardening.  Washing windows or floors. HOW CAN I BE MORE ACTIVE IN MY DAY-TO-DAY ACTIVITIES?  Use the stairs instead of the elevator.  Take a walk during your lunch break.  If you drive, park your car farther away from work or school.  If you take public transportation, get off one stop early and walk the rest of the way.  Make all of your phone calls while standing up and walking around.  Get up, stretch, and walk around every 30 minutes throughout the day. WHAT GUIDELINES SHOULD I FOLLOW WHILE EXERCISING?  Do not exercise so much that you hurt yourself, feel dizzy, or get very short of breath.  Consult your health care provider before starting a new exercise program.  Wear comfortable clothes and shoes with good support.  Drink plenty of water while you exercise to prevent dehydration or heat stroke. Body water is lost during exercise and must be replaced.  Work out until you breathe faster and your heart beats faster.   This information   is not intended to replace advice given to you by your health care provider. Make sure you discuss any questions you have with your health care provider.   Document Released: 11/15/2010 Document Revised: 11/03/2014 Document Reviewed: 03/16/2014 Elsevier Interactive Patient Education  2016 Else[employer] Inc[employer]  

## 2015-12-05 LAB — ANEMIA PROFILE B
BASOS ABS: 0 10*3/uL (ref 0.0–0.2)
BASOS: 0 %
EOS (ABSOLUTE): 0.1 10*3/uL (ref 0.0–0.4)
Eos: 2 %
Ferritin: 155 ng/mL (ref 30–400)
Folate: 13.5 ng/mL (ref >3.0)
HEMATOCRIT: 38.6 % (ref 37.5–51.0)
Hemoglobin: 13 g/dL (ref 12.6–17.7)
IMMATURE GRANS (ABS): 0 10*3/uL (ref 0.0–0.1)
Immature Granulocytes: 0 %
Iron Saturation: 18 % (ref 15–55)
Iron: 46 ug/dL (ref 38–169)
LYMPHS: 17 %
Lymphocytes Absolute: 1.3 10*3/uL (ref 0.7–3.1)
MCH: 30.6 pg (ref 26.6–33.0)
MCHC: 33.7 g/dL (ref 31.5–35.7)
MCV: 91 fL (ref 79–97)
MONOCYTES: 4 %
Monocytes Absolute: 0.3 10*3/uL (ref 0.1–0.9)
Neutrophils Absolute: 6.1 10*3/uL (ref 1.4–7.0)
Neutrophils: 77 %
PLATELETS: 280 10*3/uL (ref 150–379)
RBC: 4.25 x10E6/uL (ref 4.14–5.80)
RDW: 14 % (ref 12.3–15.4)
RETIC CT PCT: 1.6 % (ref 0.6–2.6)
TIBC: 257 ug/dL (ref 250–450)
UIBC: 211 ug/dL (ref 111–343)
Vitamin B-12: 251 pg/mL (ref 211–946)
WBC: 7.9 10*3/uL (ref 3.4–10.8)

## 2015-12-05 LAB — CMP14+EGFR
A/G RATIO: 1.9 (ref 1.1–2.5)
ALT: 31 IU/L (ref 0–44)
AST: 23 IU/L (ref 0–40)
Albumin: 4.3 g/dL (ref 3.5–4.8)
Alkaline Phosphatase: 57 IU/L (ref 39–117)
BUN / CREAT RATIO: 12 (ref 10–22)
BUN: 15 mg/dL (ref 8–27)
Bilirubin Total: 0.4 mg/dL (ref 0.0–1.2)
CALCIUM: 9.1 mg/dL (ref 8.6–10.2)
CO2: 24 mmol/L (ref 18–29)
Chloride: 100 mmol/L (ref 96–106)
Creatinine, Ser: 1.27 mg/dL (ref 0.76–1.27)
GFR, EST AFRICAN AMERICAN: 64 mL/min/{1.73_m2} (ref >59)
GFR, EST NON AFRICAN AMERICAN: 55 mL/min/{1.73_m2} — AB (ref >59)
GLOBULIN, TOTAL: 2.3 g/dL (ref 1.5–4.5)
Glucose: 242 mg/dL — ABNORMAL HIGH (ref 65–99)
POTASSIUM: 4.6 mmol/L (ref 3.5–5.2)
SODIUM: 138 mmol/L (ref 134–144)
Total Protein: 6.6 g/dL (ref 6.0–8.5)

## 2015-12-05 LAB — LIPID PANEL
CHOL/HDL RATIO: 5.5 ratio — AB (ref 0.0–5.0)
Cholesterol, Total: 142 mg/dL (ref 100–199)
HDL: 26 mg/dL — ABNORMAL LOW (ref >39)
LDL Calculated: 54 mg/dL (ref 0–99)
TRIGLYCERIDES: 309 mg/dL — AB (ref 0–149)
VLDL Cholesterol Cal: 62 mg/dL — ABNORMAL HIGH (ref 5–40)

## 2015-12-12 LAB — SPECIMEN STATUS REPORT

## 2015-12-12 LAB — HGB A1C W/O EAG: HEMOGLOBIN A1C: 6.6 % — AB (ref 4.8–5.6)

## 2016-03-20 ENCOUNTER — Other Ambulatory Visit: Payer: Self-pay | Admitting: Nurse Practitioner

## 2016-06-02 ENCOUNTER — Encounter: Payer: Self-pay | Admitting: Nurse Practitioner

## 2016-06-02 ENCOUNTER — Ambulatory Visit (INDEPENDENT_AMBULATORY_CARE_PROVIDER_SITE_OTHER): Payer: Commercial Managed Care - HMO | Admitting: Nurse Practitioner

## 2016-06-02 VITALS — BP 132/68 | HR 70 | Temp 97.1°F | Ht 72.0 in | Wt 244.2 lb

## 2016-06-02 DIAGNOSIS — G629 Polyneuropathy, unspecified: Secondary | ICD-10-CM

## 2016-06-02 DIAGNOSIS — Z6834 Body mass index (BMI) 34.0-34.9, adult: Secondary | ICD-10-CM | POA: Diagnosis not present

## 2016-06-02 DIAGNOSIS — IMO0001 Reserved for inherently not codable concepts without codable children: Secondary | ICD-10-CM

## 2016-06-02 DIAGNOSIS — R609 Edema, unspecified: Secondary | ICD-10-CM

## 2016-06-02 DIAGNOSIS — E785 Hyperlipidemia, unspecified: Secondary | ICD-10-CM | POA: Diagnosis not present

## 2016-06-02 DIAGNOSIS — J449 Chronic obstructive pulmonary disease, unspecified: Secondary | ICD-10-CM | POA: Diagnosis not present

## 2016-06-02 DIAGNOSIS — I10 Essential (primary) hypertension: Secondary | ICD-10-CM | POA: Diagnosis not present

## 2016-06-02 MED ORDER — FUROSEMIDE 20 MG PO TABS: 20.0000 mg | ORAL_TABLET | Freq: Every day | ORAL | 1 refills | Status: DC

## 2016-06-02 MED ORDER — FLUTICASONE-SALMETEROL 100-50 MCG/DOSE IN AEPB: INHALATION_SPRAY | RESPIRATORY_TRACT | 5 refills | Status: DC

## 2016-06-02 MED ORDER — GABAPENTIN 300 MG PO CAPS: ORAL_CAPSULE | ORAL | 5 refills | Status: DC

## 2016-06-02 MED ORDER — MAGNESIUM 400 MG PO CAPS: 400.0000 mg | ORAL_CAPSULE | Freq: Every day | ORAL | 1 refills | Status: DC

## 2016-06-02 MED ORDER — ALBUTEROL SULFATE HFA 108 (90 BASE) MCG/ACT IN AERS: 2.0000 | INHALATION_SPRAY | Freq: Four times a day (QID) | RESPIRATORY_TRACT | 1 refills | Status: DC | PRN

## 2016-06-02 MED ORDER — LISINOPRIL 40 MG PO TABS: ORAL_TABLET | ORAL | 5 refills | Status: DC

## 2016-06-02 MED ORDER — ATORVASTATIN CALCIUM 20 MG PO TABS: ORAL_TABLET | ORAL | 5 refills | Status: DC

## 2016-06-02 NOTE — Patient Instructions (Signed)
Fall Prevention in the Home  Falls can cause injuries and can affect people from all age groups. There are many simple things that you can do to make your home safe and to help prevent falls. WHAT CAN I DO ON THE OUTSIDE OF MY HOME?  Regularly repair the edges of walkways and driveways and fix any cracks.  Remove high doorway thresholds.  Trim any shrubbery on the main path into your home.  Use bright outdoor lighting.  Clear walkways of debris and clutter, including tools and rocks.  Regularly check that handrails are securely fastened and in good repair. Both sides of any steps should have handrails.  Install guardrails along the edges of any raised decks or porches.  Have leaves, snow, and ice cleared regularly.  Use sand or salt on walkways during winter months.  In the garage, clean up any spills right away, including grease or oil spills. WHAT CAN I DO IN THE BATHROOM?  Use night lights.  Install grab bars by the toilet and in the tub and shower. Do not use towel bars as grab bars.  Use non-skid mats or decals on the floor of the tub or shower.  If you need to sit down while you are in the shower, use a plastic, non-slip stool..  Keep the floor dry. Immediately clean up any water that spills on the floor.  Remove soap buildup in the tub or shower on a regular basis.  Attach bath mats securely with double-sided non-slip rug tape.  Remove throw rugs and other tripping hazards from the floor. WHAT CAN I DO IN THE BEDROOM?  Use night lights.  Make sure that a bedside light is easy to reach.  Do not use oversized bedding that drapes onto the floor.  Have a firm chair that has side arms to use for getting dressed.  Remove throw rugs and other tripping hazards from the floor. WHAT CAN I DO IN THE KITCHEN?   Clean up any spills right away.  Avoid walking on wet floors.  Place frequently used items in easy-to-reach places.  If you need to reach for something  above you, use a sturdy step stool that has a grab bar.  Keep electrical cables out of the way.  Do not use floor polish or wax that makes floors slippery. If you have to use wax, make sure that it is non-skid floor wax.  Remove throw rugs and other tripping hazards from the floor. WHAT CAN I DO IN THE STAIRWAYS?  Do not leave any items on the stairs.  Make sure that there are handrails on both sides of the stairs. Fix handrails that are broken or loose. Make sure that handrails are as long as the stairways.  Check any carpeting to make sure that it is firmly attached to the stairs. Fix any carpet that is loose or worn.  Avoid having throw rugs at the top or bottom of stairways, or secure the rugs with carpet tape to prevent them from moving.  Make sure that you have a light switch at the top of the stairs and the bottom of the stairs. If you do not have them, have them installed. WHAT ARE SOME OTHER FALL PREVENTION TIPS?  Wear closed-toe shoes that fit well and support your feet. Wear shoes that have rubber soles or low heels.  When you use a stepladder, make sure that it is completely opened and that the sides are firmly locked. Have someone hold the ladder while you   are using it. Do not climb a closed stepladder.  Add color or contrast paint or tape to grab bars and handrails in your home. Place contrasting color strips on the first and last steps.  Use mobility aids as needed, such as canes, walkers, scooters, and crutches.  Turn on lights if it is dark. Replace any light bulbs that burn out.  Set up furniture so that there are clear paths. Keep the furniture in the same spot.  Fix any uneven floor surfaces.  Choose a carpet design that does not hide the edge of steps of a stairway.  Be aware of any and all pets.  Review your medicines with your healthcare provider. Some medicines can cause dizziness or changes in blood pressure, which increase your risk of falling. Talk  with your health care provider about other ways that you can decrease your risk of falls. This may include working with a physical therapist or trainer to improve your strength, balance, and endurance.   This information is not intended to replace advice given to you by your health care provider. Make sure you discuss any questions you have with your health care provider.   Document Released: 10/03/2002 Document Revised: 02/27/2015 Document Reviewed: 11/17/2014 Elsevier Interactive Patient Education 2016 Elsevier Inc.  

## 2016-06-02 NOTE — Progress Notes (Signed)
Subjective:    Patient ID: Neil Smith, male    DOB: 01/28/1941, 75 y.o.   MRN: [id number]  Patient here today for follow up of chronic medical problems. NO changes since last visit. No complaints today.  Outpatient Encounter Prescriptions as of 06/02/2016  Medication Sig  . atorvastatin (LIPITOR) 20 MG tablet TAKE 1 TABLET (20 MG TOTAL) BY MOUTH DAILY AT 6 PM.  . Fluticasone-Salmeterol (ADVAIR DISKUS) 100-50 MCG/DOSE AEPB USE 1 PUFF TWICE A DAY  . Fluticasone-Salmeterol (ADVAIR DISKUS) 100-50 MCG/DOSE AEPB USE 1 PUFF TWICE A DAY  . furosemide (LASIX) 20 MG tablet TAKE 1 TABLET (20 MG TOTAL) BY MOUTH DAILY.  Marland Kitchen gabapentin (NEURONTIN) 300 MG capsule TAKE 1 CAPSULE (300 MG TOTAL) BY MOUTH AT BEDTIME.  Marland Kitchen lisinopril (PRINIVIL,ZESTRIL) 40 MG tablet TAKE 1 TABLET (40 MG TOTAL) BY MOUTH DAILY.  . Magnesium 400 MG CAPS Take by mouth.  . VENTOLIN HFA 108 (90 BASE) MCG/ACT inhaler INHALE 2 PUFFS EVERY 4 TO 6 HOURS AS NEEDED   No facility-administered encounter medications on file as of 06/02/2016.     Hypertension  This is a chronic problem. The current episode started more than 1 year ago. The problem has been gradually improving since onset. The problem is controlled. Pertinent negatives include no shortness of breath. Risk factors for coronary artery disease include male gender and obesity. Past treatments include ACE inhibitors. The current treatment provides mild improvement. There are no compliance problems.  There is no history of CAD/MI, CVA or retinopathy.  Hyperlipidemia  This is a chronic problem. The problem is controlled. Factors aggravating his hyperlipidemia include thiazides. Pertinent negatives include no shortness of breath. Current antihyperlipidemic treatment includes statins. The current treatment provides mild improvement of lipids. There are no compliance problems.  Risk factors for coronary artery disease include hypertension and male sex.  COPD:  Currently on advair and  reported he is doing well.  neuropathy neurotin at bedtime- helps with burning sensation in feet    Review of Systems  Constitutional: Negative.   HENT: Negative.   Eyes: Negative.   Respiratory: Negative for shortness of breath.   Cardiovascular: Negative.   Gastrointestinal: Negative.   Endocrine: Negative.   Genitourinary: Negative.   Musculoskeletal: Negative.   Skin: Negative.   Allergic/Immunologic: Negative.   Neurological: Negative.   Hematological: Negative.   Psychiatric/Behavioral: Negative.        Objective:   Physical Exam  Constitutional: He is oriented to person, place, and time. He appears well-developed and well-nourished.  HENT:  Head: Normocephalic.  Eyes: Pupils are equal, round, and reactive to light.  Neck: Normal range of motion.  Cardiovascular: Normal rate and regular rhythm.   Pulmonary/Chest: Effort normal and breath sounds normal.  Abdominal: Soft.  Musculoskeletal: Normal range of motion. He exhibits edema (1+ edema bil lower ext).  Neurological: He is alert and oriented to person, place, and time. He has normal reflexes.  Skin: Skin is warm.  Psychiatric: He has a normal mood and affect. His behavior is normal.    BP 132/68 (BP Location: Right Arm, Patient Position: Sitting, Cuff Size: Large)   Pulse 70   Temp 97.1 F (36.2 C) (Oral)   Ht 6' (1.829 m)   Wt 244 lb 3.2 oz (110.8 kg)   BMI 33.12 kg/m       Assessment & Plan:  1. Essential hypertension Do not add salt to diet - lisinopril (PRINIVIL,ZESTRIL) 40 MG tablet; TAKE 1 TABLET (40 MG TOTAL) BY  MOUTH DAILY.  Dispense: 30 tablet; Refill: 5 - CMP14+EGFR  2. COPD bronchitis - Fluticasone-Salmeterol (ADVAIR DISKUS) 100-50 MCG/DOSE AEPB; USE 1 PUFF TWICE A DAY  Dispense: 60 each; Refill: 5 - albuterol (VENTOLIN HFA) 108 (90 Base) MCG/ACT inhaler; Inhale 2 puffs into the lungs every 6 (six) hours as needed for wheezing or shortness of breath.  Dispense: 1 Inhaler; Refill: 1  3.  Neuropathy (HCC) Cool soaks if helps - gabapentin (NEURONTIN) 300 MG capsule; TAKE 1 CAPSULE (300 MG TOTAL) BY MOUTH AT BEDTIME.  Dispense: 90 capsule; Refill: 5  4. Hyperlipidemia with target LDL less than 100 Low fta diet - atorvastatin (LIPITOR) 20 MG tablet; TAKE 1 TABLET (20 MG TOTAL) BY MOUTH DAILY AT 6 PM.  Dispense: 30 tablet; Refill: 5 - Lipid panel  5. BMI 34.0-34.9,adult Discussed diet and exercise for person with BMI >25 Will recheck weight in 3-6 months   6. Peripheral edema elevate legs when sitting - furosemide (LASIX) 20 MG tablet; Take 1 tablet (20 mg total) by mouth daily.  Dispense: 90 tablet; Refill: 1 - Magnesium 400 MG CAPS; Take 400 mg by mouth daily.  Dispense: 90 capsule; Refill: 1    Labs pending Health maintenance reviewed Diet and exercise encouraged Continue all meds Follow up  In 6 month   Mary-Margaret Martin, FNP   

## 2016-06-03 LAB — LIPID PANEL
CHOL/HDL RATIO: 4.2 ratio (ref 0.0–5.0)
CHOLESTEROL TOTAL: 118 mg/dL (ref 100–199)
HDL: 28 mg/dL — AB (ref >39)
LDL CALC: 48 mg/dL (ref 0–99)
TRIGLYCERIDES: 209 mg/dL — AB (ref 0–149)
VLDL Cholesterol Cal: 42 mg/dL — ABNORMAL HIGH (ref 5–40)

## 2016-06-03 LAB — CMP14+EGFR
ALK PHOS: 62 IU/L (ref 39–117)
ALT: 23 IU/L (ref 0–44)
AST: 23 IU/L (ref 0–40)
Albumin/Globulin Ratio: 1.9 (ref 1.2–2.2)
Albumin: 4.4 g/dL (ref 3.5–4.8)
BUN/Creatinine Ratio: 9 — ABNORMAL LOW (ref 10–24)
BUN: 13 mg/dL (ref 8–27)
Bilirubin Total: 0.4 mg/dL (ref 0.0–1.2)
CALCIUM: 9.3 mg/dL (ref 8.6–10.2)
CO2: 22 mmol/L (ref 18–29)
CREATININE: 1.38 mg/dL — AB (ref 0.76–1.27)
Chloride: 103 mmol/L (ref 96–106)
GFR calc Af Amer: 57 mL/min/{1.73_m2} — ABNORMAL LOW (ref >59)
GFR, EST NON AFRICAN AMERICAN: 50 mL/min/{1.73_m2} — AB (ref >59)
GLUCOSE: 103 mg/dL — AB (ref 65–99)
Globulin, Total: 2.3 g/dL (ref 1.5–4.5)
Potassium: 4.7 mmol/L (ref 3.5–5.2)
SODIUM: 142 mmol/L (ref 134–144)
Total Protein: 6.7 g/dL (ref 6.0–8.5)

## 2016-06-05 ENCOUNTER — Telehealth: Payer: Self-pay | Admitting: *Deleted

## 2016-06-05 NOTE — Telephone Encounter (Signed)
-----   [facility]-Margaret Martin, FNP sent at 06/03/2016  8:08 AM EDT ----- blood sugar much better Kidney and liver function stable Cholesterol looks good Continue current meds- low fat diet and exercise and recheck in 3 months

## 2016-06-05 NOTE — Telephone Encounter (Signed)
Notified pt's son of results Verbalizes understanding

## 2017-01-05 ENCOUNTER — Telehealth: Payer: Self-pay | Admitting: Nurse Practitioner

## 2017-01-05 ENCOUNTER — Other Ambulatory Visit: Payer: Self-pay | Admitting: Nurse Practitioner

## 2017-01-05 DIAGNOSIS — R609 Edema, unspecified: Secondary | ICD-10-CM

## 2017-01-05 DIAGNOSIS — E785 Hyperlipidemia, unspecified: Secondary | ICD-10-CM

## 2017-01-05 DIAGNOSIS — I10 Essential (primary) hypertension: Secondary | ICD-10-CM

## 2017-01-05 NOTE — Telephone Encounter (Signed)
Patient needs followup and we will discuss at appointment

## 2017-01-05 NOTE — Telephone Encounter (Signed)
Aware. 

## 2017-04-02 ENCOUNTER — Other Ambulatory Visit: Payer: Self-pay | Admitting: Nurse Practitioner

## 2017-04-02 ENCOUNTER — Encounter: Payer: Self-pay | Admitting: Physician Assistant

## 2017-04-02 ENCOUNTER — Ambulatory Visit (INDEPENDENT_AMBULATORY_CARE_PROVIDER_SITE_OTHER): Payer: Medicare HMO | Admitting: Physician Assistant

## 2017-04-02 ENCOUNTER — Ambulatory Visit (INDEPENDENT_AMBULATORY_CARE_PROVIDER_SITE_OTHER): Payer: Medicare HMO

## 2017-04-02 VITALS — BP 137/63 | HR 98 | Temp 97.3°F | Ht 72.0 in | Wt 249.0 lb

## 2017-04-02 DIAGNOSIS — R05 Cough: Secondary | ICD-10-CM

## 2017-04-02 DIAGNOSIS — R609 Edema, unspecified: Secondary | ICD-10-CM

## 2017-04-02 DIAGNOSIS — R059 Cough, unspecified: Secondary | ICD-10-CM

## 2017-04-02 DIAGNOSIS — E785 Hyperlipidemia, unspecified: Secondary | ICD-10-CM

## 2017-04-02 DIAGNOSIS — I10 Essential (primary) hypertension: Secondary | ICD-10-CM

## 2017-04-02 DIAGNOSIS — J449 Chronic obstructive pulmonary disease, unspecified: Secondary | ICD-10-CM

## 2017-04-02 NOTE — Telephone Encounter (Signed)
Last seen 05/2016

## 2017-04-02 NOTE — Progress Notes (Signed)
Subjective:     Patient ID: Neil Smith, male   DOB: 1941/09/10, 76 y.o.   MRN: [id number]  HPI Pt here due to a 2 week hx of cough Cough has been productive of a whitish sputum Sx are actually much better and almost gone He wants just to make sure everything is good + Hx of COPD and doing well with his Advair and Alb inhalers Having to use Alb very sparingly  Review of Systems  Constitutional: Negative.   HENT: Positive for congestion. Negative for postnasal drip, rhinorrhea, sinus pain, sinus pressure, sneezing and sore throat.   Respiratory: Positive for cough and wheezing. Negative for chest tightness and shortness of breath.   Cardiovascular: Negative.        Objective:   Physical Exam  Constitutional: He appears well-developed and well-nourished.  HENT:  Right Ear: External ear normal.  Left Ear: External ear normal.  Mouth/Throat: Oropharynx is clear and moist. No oropharyngeal exudate.  Neck: Neck supple.  Cardiovascular: Normal rate, regular rhythm and normal heart sounds.   No murmur heard. Pulmonary/Chest: Effort normal. No respiratory distress. He has wheezes.  Exp wheezes only  Lymphadenopathy:    He has no cervical adenopathy.  Nursing note and vitals reviewed. CXR will be over read     Assessment:         Plan:     Fluids Rest Since sx have improved will just continue to observe Continue with current inhaler use Activities as tol F/U if sx worsen/return

## 2017-04-02 NOTE — Patient Instructions (Signed)

## 2017-04-02 NOTE — Telephone Encounter (Signed)
Only gave 30 day supply of each med- Last refill without being seen

## 2017-04-06 ENCOUNTER — Other Ambulatory Visit: Payer: Self-pay | Admitting: Nurse Practitioner

## 2017-04-06 DIAGNOSIS — R609 Edema, unspecified: Secondary | ICD-10-CM

## 2017-04-07 ENCOUNTER — Telehealth: Payer: Self-pay

## 2017-04-07 DIAGNOSIS — R609 Edema, unspecified: Secondary | ICD-10-CM

## 2017-04-07 DIAGNOSIS — I10 Essential (primary) hypertension: Secondary | ICD-10-CM

## 2017-04-07 MED ORDER — FUROSEMIDE 20 MG PO TABS: 20.0000 mg | ORAL_TABLET | Freq: Every day | ORAL | 0 refills | Status: DC

## 2017-04-07 MED ORDER — LISINOPRIL 40 MG PO TABS: ORAL_TABLET | ORAL | 0 refills | Status: DC

## 2017-04-14 NOTE — Telephone Encounter (Signed)
x

## 2017-06-30 ENCOUNTER — Other Ambulatory Visit: Payer: Self-pay | Admitting: Nurse Practitioner

## 2017-07-01 ENCOUNTER — Other Ambulatory Visit: Payer: Self-pay | Admitting: Nurse Practitioner

## 2017-07-01 MED ORDER — FLUTICASONE-SALMETEROL 100-50 MCG/DOSE IN AEPB: INHALATION_SPRAY | RESPIRATORY_TRACT | 5 refills | Status: DC

## 2017-07-04 ENCOUNTER — Other Ambulatory Visit: Payer: Self-pay | Admitting: Nurse Practitioner

## 2017-07-04 DIAGNOSIS — R609 Edema, unspecified: Secondary | ICD-10-CM

## 2017-07-04 DIAGNOSIS — G629 Polyneuropathy, unspecified: Secondary | ICD-10-CM

## 2017-07-04 DIAGNOSIS — I10 Essential (primary) hypertension: Secondary | ICD-10-CM

## 2017-07-04 DIAGNOSIS — E785 Hyperlipidemia, unspecified: Secondary | ICD-10-CM

## 2017-07-28 ENCOUNTER — Other Ambulatory Visit: Payer: Self-pay | Admitting: Nurse Practitioner

## 2017-10-01 ENCOUNTER — Other Ambulatory Visit: Payer: Self-pay | Admitting: Nurse Practitioner

## 2017-10-01 DIAGNOSIS — R609 Edema, unspecified: Secondary | ICD-10-CM

## 2017-10-12 ENCOUNTER — Other Ambulatory Visit: Payer: Self-pay | Admitting: Nurse Practitioner

## 2017-10-12 DIAGNOSIS — R609 Edema, unspecified: Secondary | ICD-10-CM

## 2017-10-12 DIAGNOSIS — I10 Essential (primary) hypertension: Secondary | ICD-10-CM

## 2017-10-12 DIAGNOSIS — E785 Hyperlipidemia, unspecified: Secondary | ICD-10-CM

## 2017-10-12 DIAGNOSIS — G629 Polyneuropathy, unspecified: Secondary | ICD-10-CM

## 2017-10-13 NOTE — Telephone Encounter (Signed)
MMM PCP Last seen 04/02/17  [facility]

## 2017-10-14 ENCOUNTER — Ambulatory Visit (INDEPENDENT_AMBULATORY_CARE_PROVIDER_SITE_OTHER): Payer: Medicare HMO | Admitting: *Deleted

## 2017-10-14 VITALS — BP 145/76 | HR 83 | Ht 70.0 in | Wt 252.0 lb

## 2017-10-14 DIAGNOSIS — Z23 Encounter for immunization: Secondary | ICD-10-CM | POA: Diagnosis not present

## 2017-10-14 DIAGNOSIS — Z0000 Encounter for general adult medical examination without abnormal findings: Secondary | ICD-10-CM | POA: Diagnosis not present

## 2017-10-14 NOTE — Progress Notes (Signed)
Subjective:   Neil Smith is a 76 y.o. male who presents for an Initial Medicare Annual Wellness Visit. Mr Garman is accompanied today by his son who helps to provide some of his history. He is retired  From the [employer] and lives at home alone. He enjoys socializing with friends at [employer] each morning.   Review of Systems  Health is about the same as last year.   Cardiac Risk Factors include: advanced age (>41men, >25 women);obesity (BMI >30kg/m2);sedentary lifestyle;hypertension;male gender;dyslipidemia  Other systems negative today.    Objective:    Today's Vitals   10/14/17 1531  BP: (!) 145/76  Pulse: 83  Weight: 252 lb (114.3 kg)  Height: 5\' 10"  (1.778 m)   Body mass index is 36.16 kg/m.  Advanced Directives 05/09/2015  Does Patient Have a Medical Advance Directive? Yes;No  Does patient want to make changes to medical advance directive? Yes - information given    Current Medications (verified) Outpatient Encounter Medications as of 10/14/2017  Medication Sig  . albuterol (PROVENTIL HFA;VENTOLIN HFA) 108 (90 Base) MCG/ACT inhaler Inhale 2 puffs into the lungs every 6 (six) hours as needed for wheezing or shortness of breath.  Marland Kitchen atorvastatin (LIPITOR) 20 MG tablet TAKE 1 TABLET BY MOUTH EVERY DAY IN THE EVENING  . Fluticasone-Salmeterol (ADVAIR DISKUS) 100-50 MCG/DOSE AEPB USE 1 PUFF TWICE A DAY  . furosemide (LASIX) 20 MG tablet TAKE 1 TABLET BY MOUTH EVERY DAY  . gabapentin (NEURONTIN) 300 MG capsule TAKE 1 CAPSULE BY MOUTH EVERYDAY AT BEDTIME  . lisinopril (PRINIVIL,ZESTRIL) 40 MG tablet TAKE 1 TABLET BY MOUTH EVERY DAY  . loratadine (CLARITIN) 10 MG tablet Take 10 mg by mouth daily.  . magnesium oxide (MAG-OX) 400 MG tablet TAKE 1 TABLET BY MOUTH EVERY DAY  . ADVAIR DISKUS 100-50 MCG/DOSE AEPB INHALE 1 PUFF TWICE DAILY  . albuterol (PROVENTIL HFA;VENTOLIN HFA) 108 (90 Base) MCG/ACT inhaler TAKE 2 PUFFS BY MOUTH EVERY 6 HOURS AS NEEDED FOR WHEEZE  OR SHORTNESS OF BREATH   No facility-administered encounter medications on file as of 10/14/2017.     Allergies (verified) Patient has no known allergies.   History: Past Medical History:  Diagnosis Date  . COPD (chronic obstructive pulmonary disease) (HCC)   . Hyperlipidemia   . Hypertension    No past surgical history on file. Family History  Problem Relation Age of Onset  . Dementia Mother 59  . Heart attack Brother   . Hyperlipidemia Son   . Heart attack Brother   . Alcohol abuse Brother   . Cancer Brother 48       Lung  . Hyperlipidemia Son    Social History   Socioeconomic History  . Marital status: Widowed    Spouse name: Not on file  . Number of children: 2  . Years of education: 7  . Highest education level: 7th grade  Social Needs  . Financial resource strain: Not hard at all  . Food insecurity - worry: Never true  . Food insecurity - inability: Never true  . Transportation needs - medical: No  . Transportation needs - non-medical: No  Occupational History  . Occupation: Retired    Comment: Designer, fashion/clothing  Tobacco Use  . Smoking status: Former Smoker    Types: Cigarettes    Last attempt to quit: 10/14/2002    Years since quitting: 15.0  . Smokeless tobacco: Never Used  Substance and Sexual Activity  . Alcohol use: No  . Drug use: No  .  Sexual activity: Not on file  Other Topics Concern  . Not on file  Social History Narrative  . Not on file   Clinical Intake:  Nutritional Status: BMI > 30  Obese Diabetes: No  How often do you need to have someone help you when you read instructions, pamphlets, or other written materials from your doctor or pharmacy?: 5 - Always What is the last grade level you completed in school?: 7th  Interpreter Needed?: No  Information entered by :: Demetrios LollKristen Derrika Ruffalo, RN  Activities of Daily Living In your present state of health, do you have any difficulty performing the following activities: 10/14/2017 10/14/2017  Hearing?  - N  Vision? - N  Difficulty concentrating or making decisions? - N  Walking or climbing stairs? - Y  Comment - One story home. Two steps into house.   Dressing or bathing? - N  Doing errands, shopping? - N  Quarry managerreparing Food and eating ? - N  Using the Toilet? - N  In the past six months, have you accidently leaked urine? - N  Do you have problems with loss of bowel control? - N  Managing your Medications? (No Data) N  Comment uses a pillbox to organize weekly -  Managing your Finances? N -  Housekeeping or managing your Housekeeping? N -  Some recent data might be hidden     Immunizations and Health Maintenance Immunization History  Administered Date(s) Administered  . Influenza, High Dose Seasonal PF 07/03/2014, 07/28/2016, 08/25/2017  . Influenza,inj,Quad PF,6+ Mos 08/15/2013, 07/30/2015  . Pneumococcal Conjugate-13 05/09/2015  . Pneumococcal Polysaccharide-23 10/14/2017   Health Maintenance Due  Topic Date Due  . TETANUS/TDAP  02/14/1960  . PNA vac Low Risk Adult (2 of 2 - PPSV23) 05/08/2016    Patient Care Team: Bennie PieriniMartin, Mary-Margaret, FNP as PCP - General (Nurse Practitioner)  No hospitalizations, ER visits, or surgeries this past year.   Assessment:   This is a routine wellness examination for Neil Smith.  Hearing/Vision screen No deficits noted during visit.   Goals 150 min of physical activity a week No falls within the next year  Dietary issues and exercise activities discussed: Current Exercise Habits: The patient does not participate in regular exercise at present(Stays active around his home and yard), Exercise limited by: neurologic condition(s);respiratory conditions(s)  Diet Breakfast at [employer] daily Light lunch or a pack of nabs Dinner beanie weenies or a similar food or something from [employer]  Depression Screen PHQ 2/9 Scores 10/14/2017 04/02/2017 06/02/2016 05/09/2015  PHQ - 2 Score 0 0 0 0    Fall Risk Fall Risk  04/02/2017 06/02/2016 05/09/2015  07/18/2014 01/09/2014  Falls in the past year? No No No No No   Cognitive Function:  Unable to complete accurately due to literacy level      Screening Tests Health Maintenance  Topic Date Due  . TETANUS/TDAP  02/14/1960  . PNA vac Low Risk Adult (2 of 2 - PPSV23) 05/08/2016  . INFLUENZA VACCINE  Completed      Plan:  Pneumovax given today Keep rescue inhaler with you Increase physical activity as tolerated. Aim for 150 minutes a week Move carefully to avoid falls.  Keep f/u with PCP Check cost of tdap at next visit  I have personally reviewed and noted the following in the patient's chart:   . Medical and social history . Use of alcohol, tobacco or illicit drugs  . Current medications and supplements . Functional ability and status . Nutritional status . Physical  activity . Advanced directives . List of other physicians . Hospitalizations, surgeries, and ER visits in previous 12 months . Vitals . Screenings to include cognitive, depression, and falls . Referrals and appointments  In addition, I have reviewed and discussed with patient certain preventive protocols, quality metrics, and best practice recommendations. A written personalized care plan for preventive services as well as general preventive health recommendations were provided to patient.     Demetrios LollKristen Melesio Madara, RN   10/14/2017    I have reviewed and agree with the above AWV documentation.   Rex Krasarol Vincent, MD [facility]

## 2017-10-14 NOTE — Patient Instructions (Signed)
  Mr. Neil Smith , Thank you for taking time to come for your Medicare Wellness Visit. I appreciate your ongoing commitment to your health goals. Please review the following plan we discussed and let me know if I can assist you in the future.   These are the goals we discussed: Move carefully to avoid falls Continue to stay active daily  This is a list of the screening recommended for you and due dates:  Health Maintenance  Topic Date Due  . Tetanus Vaccine  02/14/1960  . Pneumonia vaccines (2 of 2 - PPSV23) 05/08/2016  . Flu Shot  Completed   Review Advance Directives and bring a completed copy to our office.

## 2017-12-21 ENCOUNTER — Other Ambulatory Visit: Payer: Self-pay | Admitting: *Deleted

## 2017-12-21 MED ORDER — FLUTICASONE-SALMETEROL 100-50 MCG/DOSE IN AEPB: 1.0000 | INHALATION_SPRAY | Freq: Two times a day (BID) | RESPIRATORY_TRACT | 0 refills | Status: DC

## 2018-01-12 ENCOUNTER — Other Ambulatory Visit: Payer: Self-pay | Admitting: Nurse Practitioner

## 2018-01-12 DIAGNOSIS — R609 Edema, unspecified: Secondary | ICD-10-CM

## 2018-01-13 NOTE — Telephone Encounter (Signed)
Last seen 04/02/17  MMM PCP

## 2018-01-19 ENCOUNTER — Other Ambulatory Visit: Payer: Self-pay | Admitting: *Deleted

## 2018-01-19 DIAGNOSIS — R609 Edema, unspecified: Secondary | ICD-10-CM

## 2018-01-19 DIAGNOSIS — E785 Hyperlipidemia, unspecified: Secondary | ICD-10-CM

## 2018-01-19 DIAGNOSIS — G629 Polyneuropathy, unspecified: Secondary | ICD-10-CM

## 2018-01-19 DIAGNOSIS — I10 Essential (primary) hypertension: Secondary | ICD-10-CM

## 2018-01-19 MED ORDER — LISINOPRIL 40 MG PO TABS: 40.0000 mg | ORAL_TABLET | Freq: Every day | ORAL | 0 refills | Status: DC

## 2018-01-19 MED ORDER — GABAPENTIN 300 MG PO CAPS: ORAL_CAPSULE | ORAL | 0 refills | Status: DC

## 2018-01-19 MED ORDER — ATORVASTATIN CALCIUM 20 MG PO TABS: ORAL_TABLET | ORAL | 0 refills | Status: DC

## 2018-01-19 MED ORDER — FUROSEMIDE 20 MG PO TABS: 20.0000 mg | ORAL_TABLET | Freq: Every day | ORAL | 0 refills | Status: DC

## 2018-03-17 ENCOUNTER — Other Ambulatory Visit: Payer: Self-pay | Admitting: Nurse Practitioner

## 2018-03-17 DIAGNOSIS — R609 Edema, unspecified: Secondary | ICD-10-CM

## 2018-04-21 ENCOUNTER — Other Ambulatory Visit: Payer: Self-pay | Admitting: Nurse Practitioner

## 2018-04-21 DIAGNOSIS — I10 Essential (primary) hypertension: Secondary | ICD-10-CM

## 2018-04-21 DIAGNOSIS — R609 Edema, unspecified: Secondary | ICD-10-CM

## 2018-04-21 DIAGNOSIS — E785 Hyperlipidemia, unspecified: Secondary | ICD-10-CM

## 2018-04-21 DIAGNOSIS — G629 Polyneuropathy, unspecified: Secondary | ICD-10-CM

## 2018-04-23 ENCOUNTER — Telehealth: Payer: Self-pay | Admitting: Nurse Practitioner

## 2018-04-23 NOTE — Telephone Encounter (Signed)
Spoke with son and advised that he still needs to keep follow up appt eventhough he was seen for an AWV in Dec. Son verbalized understanding.

## 2018-04-23 NOTE — Telephone Encounter (Signed)
PT is scheduled for 7/5 to see MMM for med refills, daugther in law called and wants to know if the pt really needs to be seen since he had an AWV with nurse in Dec, she feels like he shouldn't have to be seen, I explained to her that he hasn't seen MMM since 05/2016 that he will more than likely will have to come in for an apt, but she wanted me to check to make sure.  Pt does needs refills on atorvastatin, furosemide, gabaentin, lisinopril, the pharmacy had sent over refill request but denied it saying pt needed to be seen.

## 2018-04-30 ENCOUNTER — Ambulatory Visit (INDEPENDENT_AMBULATORY_CARE_PROVIDER_SITE_OTHER): Payer: Medicare HMO | Admitting: Nurse Practitioner

## 2018-04-30 ENCOUNTER — Telehealth: Payer: Self-pay | Admitting: Nurse Practitioner

## 2018-04-30 ENCOUNTER — Encounter: Payer: Self-pay | Admitting: Nurse Practitioner

## 2018-04-30 VITALS — BP 113/61 | HR 92 | Temp 97.5°F | Ht 70.0 in | Wt 254.0 lb

## 2018-04-30 DIAGNOSIS — Z125 Encounter for screening for malignant neoplasm of prostate: Secondary | ICD-10-CM

## 2018-04-30 DIAGNOSIS — Z6834 Body mass index (BMI) 34.0-34.9, adult: Secondary | ICD-10-CM

## 2018-04-30 DIAGNOSIS — I10 Essential (primary) hypertension: Secondary | ICD-10-CM | POA: Diagnosis not present

## 2018-04-30 DIAGNOSIS — G629 Polyneuropathy, unspecified: Secondary | ICD-10-CM | POA: Diagnosis not present

## 2018-04-30 DIAGNOSIS — E785 Hyperlipidemia, unspecified: Secondary | ICD-10-CM | POA: Diagnosis not present

## 2018-04-30 DIAGNOSIS — R609 Edema, unspecified: Secondary | ICD-10-CM | POA: Insufficient documentation

## 2018-04-30 DIAGNOSIS — J410 Simple chronic bronchitis: Secondary | ICD-10-CM | POA: Diagnosis not present

## 2018-04-30 MED ORDER — LISINOPRIL 40 MG PO TABS: 40.0000 mg | ORAL_TABLET | Freq: Every day | ORAL | 0 refills | Status: DC

## 2018-04-30 MED ORDER — ATORVASTATIN CALCIUM 20 MG PO TABS: ORAL_TABLET | ORAL | 0 refills | Status: DC

## 2018-04-30 MED ORDER — FUROSEMIDE 20 MG PO TABS: 20.0000 mg | ORAL_TABLET | Freq: Every day | ORAL | 0 refills | Status: DC

## 2018-04-30 MED ORDER — FLUTICASONE-SALMETEROL 250-50 MCG/DOSE IN AEPB
1.0000 | INHALATION_SPRAY | Freq: Two times a day (BID) | RESPIRATORY_TRACT | 3 refills | Status: DC
Start: 2018-04-30 — End: 2018-08-26

## 2018-04-30 MED ORDER — GABAPENTIN 300 MG PO CAPS: ORAL_CAPSULE | ORAL | 0 refills | Status: DC

## 2018-04-30 NOTE — Telephone Encounter (Signed)
Prescription sent to pharmacy.

## 2018-04-30 NOTE — Telephone Encounter (Signed)
What is the name of the medication? Furosemide 20 mg -- Patient was seen today with MMM and didn't call this RX in  Have you contacted your pharmacy to request a refill? NO   Which [facility] [place] [facility] [place]   Patient notified that their request is being sent to the clinical staff for review and that they should receive a call once it is complete. If they do not receive a call within 24 hours they can check with their pharmacy or our office.

## 2018-04-30 NOTE — Progress Notes (Addendum)
Subjective:    Patient ID: Neil Smith, male    DOB: 26-Dec-1940, 77 y.o.   MRN: [id number]   Chief Complaint: Medical Management of Chronic Issues   HPI:  1. Essential hypertension No c/o chest pain, or headache. Does not check blood pressure at home. BP Readings from Last 3 Encounters:  10/14/17 (!) 145/76  04/02/17 137/63  06/02/16 132/68      2. Simple chronic bronchitis ([place])  Uses advair daily. Denies any increasing sob. He is worse in summer when it is really hot  3. Neuropathy  Numbness in bil feet- burning sensation often. Takes neurotin daily which helps.  4. Hyperlipidemia with target LDL less than 100  Does not watch diet. Does very little exercise  5. BMI 34.0-34.9,adult  Weight is up 2 lbs today from last visit    Outpatient Encounter Medications as of 04/30/2018  Medication Sig  . albuterol (PROVENTIL HFA;VENTOLIN HFA) 108 (90 Base) MCG/ACT inhaler Inhale 2 puffs into the lungs every 6 (six) hours as needed for wheezing or shortness of breath.  Marland Kitchen albuterol (PROVENTIL HFA;VENTOLIN HFA) 108 (90 Base) MCG/ACT inhaler TAKE 2 PUFFS BY MOUTH EVERY 6 HOURS AS NEEDED FOR WHEEZE OR SHORTNESS OF BREATH  . atorvastatin (LIPITOR) 20 MG tablet TAKE 1 TABLET BY MOUTH EVERY DAY IN THE EVENING  . Fluticasone-Salmeterol (ADVAIR DISKUS) 100-50 MCG/DOSE AEPB USE 1 PUFF TWICE A DAY  . Fluticasone-Salmeterol (ADVAIR DISKUS) 100-50 MCG/DOSE AEPB Inhale 1 puff into the lungs 2 (two) times daily.  . furosemide (LASIX) 20 MG tablet Take 1 tablet (20 mg total) by mouth daily.  Marland Kitchen gabapentin (NEURONTIN) 300 MG capsule TAKE 1 CAPSULE BY MOUTH EVERYDAY AT BEDTIME  . lisinopril (PRINIVIL,ZESTRIL) 40 MG tablet Take 1 tablet (40 mg total) by mouth daily.  Marland Kitchen loratadine (CLARITIN) 10 MG tablet Take 10 mg by mouth daily.  . magnesium oxide (MAG-OX) 400 MG tablet TAKE 1 TABLET BY MOUTH EVERY DAY      New complaints: none today  Social history: Lives by hisself. His son checks on him  frequently  Review of Systems  Constitutional: Negative for activity change and appetite change.  HENT: Negative.   Eyes: Negative for pain.  Respiratory: Positive for cough (slight) and shortness of breath (slight).   Cardiovascular: Positive for leg swelling. Negative for chest pain and palpitations.  Gastrointestinal: Negative for abdominal pain.  Endocrine: Negative for polydipsia.  Genitourinary: Negative.   Musculoskeletal: Negative.   Skin: Negative for rash.  Neurological: Negative for dizziness, weakness and headaches.  Hematological: Does not bruise/bleed easily.  Psychiatric/Behavioral: Negative.   All other systems reviewed and are negative.      Objective:   Physical Exam  Constitutional: He is oriented to person, place, and time. He appears well-developed and well-nourished. No distress.  HENT:  Head: Normocephalic.  Nose: Nose normal.  Mouth/Throat: Oropharynx is clear and moist.  Eyes: Pupils are equal, round, and reactive to light. EOM are normal.  Neck: Normal range of motion and phonation normal. Neck supple. No JVD present. Carotid bruit is not present. No thyroid mass and no thyromegaly present.  Cardiovascular: Normal rate and regular rhythm.  Pulmonary/Chest: Effort normal and breath sounds normal. No respiratory distress.  Abdominal: Soft. Normal appearance, normal aorta and bowel sounds are normal. There is no tenderness.  Musculoskeletal: Normal range of motion. He exhibits edema (mild peripheral edema).  Lymphadenopathy:    He has no cervical adenopathy.  Neurological: He is alert and oriented to person,  place, and time.  Skin: Skin is warm and dry.  Psychiatric: He has a normal mood and affect. His behavior is normal. Judgment and thought content normal.   BP 113/61   Pulse 92   Temp (!) 97.5 F (36.4 C) (Oral)   Ht 5' 10"  (1.778 m)   Wt 254 lb (115.2 kg)   BMI 36.45 kg/m         Assessment & Plan:  Neil Smith comes in today with  chief complaint of Medical Management of Chronic Issues   Diagnosis and orders addressed:  1. Essential hypertension Low sodium diet - lisinopril (PRINIVIL,ZESTRIL) 40 MG tablet; Take 1 tablet (40 mg total) by mouth daily.  Dispense: 90 tablet; Refill: 0 - CMP14+EGFR  2. Simple chronic bronchitis ([place]) Avoid going outsie when really hot and humid Increase advair from 100/50 to 250/50 1 puff bid - Fluticasone-Salmeterol (ADVAIR DISKUS) 250-50 MCG/DOSE AEPB; Inhale 1 puff into the lungs 2 (two) times daily.  Dispense: 1 each; Refill: 3  3. Neuropathy Do not go barefooted - gabapentin (NEURONTIN) 300 MG capsule; TAKE 1 CAPSULE BY MOUTH EVERYDAY AT BEDTIME  Dispense: 90 capsule; Refill: 0  4. Hyperlipidemia with target LDL less than 100 Low fat diet - atorvastatin (LIPITOR) 20 MG tablet; TAKE 1 TABLET BY MOUTH EVERY DAY IN THE EVENING  Dispense: 90 tablet; Refill: 0 - Lipid panel  5. BMI 34.0-34.9,adult Discussed diet and exercise for person with BMI >25 Will recheck weight in 3-6 months  6. Prostate cancer screening - PSA, total and free  7. Peripheral edema Compression socks  Labs pending Health Maintenance reviewed Diet and exercise encouraged  Follow up plan: 6 months   [place], FNP

## 2018-04-30 NOTE — Patient Instructions (Signed)
DASH Eating Plan DASH stands for "Dietary Approaches to Stop Hypertension." The DASH eating plan is a healthy eating plan that has been shown to reduce high blood pressure (hypertension). It may also reduce your risk for type 2 diabetes, heart disease, and stroke. The DASH eating plan may also help with weight loss. What are tips for following this plan? General guidelines  Avoid eating more than 2,300 mg (milligrams) of salt (sodium) a day. If you have hypertension, you may need to reduce your sodium intake to 1,500 mg a day.  Limit alcohol intake to no more than 1 drink a day for nonpregnant women and 2 drinks a day for men. One drink equals 12 oz of beer, 5 oz of wine, or 1 oz of hard liquor.  Work with your health care provider to maintain a healthy body weight or to lose weight. Ask what an ideal weight is for you.  Get at least 30 minutes of exercise that causes your heart to beat faster (aerobic exercise) most days of the week. Activities may include walking, swimming, or biking.  Work with your health care provider or diet and nutrition specialist (dietitian) to adjust your eating plan to your individual calorie needs. Reading food labels  Check food labels for the amount of sodium per serving. Choose foods with less than 5 percent of the Daily Value of sodium. Generally, foods with less than 300 mg of sodium per serving fit into this eating plan.  To find whole grains, look for the word "whole" as the first word in the ingredient list. Shopping  Buy products labeled as "low-sodium" or "no salt added."  Buy fresh foods. Avoid canned foods and premade or frozen meals. Cooking  Avoid adding salt when cooking. Use salt-free seasonings or herbs instead of table salt or sea salt. Check with your health care provider or pharmacist before using salt substitutes.  Do not fry foods. Cook foods using healthy methods such as baking, boiling, grilling, and broiling instead.  Cook with  heart-healthy oils, such as olive, canola, soybean, or sunflower oil. Meal planning   Eat a balanced diet that includes: ? 5 or more servings of fruits and vegetables each day. At each meal, try to fill half of your plate with fruits and vegetables. ? Up to 6-8 servings of whole grains each day. ? Less than 6 oz of lean meat, poultry, or fish each day. A 3-oz serving of meat is about the same size as a deck of cards. One egg equals 1 oz. ? 2 servings of low-fat dairy each day. ? A serving of nuts, seeds, or beans 5 times each week. ? Heart-healthy fats. Healthy fats called Omega-3 fatty acids are found in foods such as flaxseeds and coldwater fish, like sardines, salmon, and mackerel.  Limit how much you eat of the following: ? Canned or prepackaged foods. ? Food that is high in trans fat, such as fried foods. ? Food that is high in saturated fat, such as fatty meat. ? Sweets, desserts, sugary drinks, and other foods with added sugar. ? Full-fat dairy products.  Do not salt foods before eating.  Try to eat at least 2 vegetarian meals each week.  Eat more home-cooked food and less restaurant, buffet, and fast food.  When eating at a restaurant, ask that your food be prepared with less salt or no salt, if possible. What foods are recommended? The items listed may not be a complete list. Talk with your dietitian about what   dietary choices are best for you. Grains Whole-grain or whole-wheat bread. Whole-grain or whole-wheat pasta. Brown rice. Oatmeal. Quinoa. Bulgur. Whole-grain and low-sodium cereals. Pita bread. Low-fat, low-sodium crackers. Whole-wheat flour tortillas. Vegetables Fresh or frozen vegetables (raw, steamed, roasted, or grilled). Low-sodium or reduced-sodium tomato and vegetable juice. Low-sodium or reduced-sodium tomato sauce and tomato paste. Low-sodium or reduced-sodium canned vegetables. Fruits All fresh, dried, or frozen fruit. Canned fruit in natural juice (without  added sugar). Meat and other protein foods Skinless chicken or turkey. Ground chicken or turkey. Pork with fat trimmed off. Fish and seafood. Egg whites. Dried beans, peas, or lentils. Unsalted nuts, nut butters, and seeds. Unsalted canned beans. Lean cuts of beef with fat trimmed off. Low-sodium, lean deli meat. Dairy Low-fat (1%) or fat-free (skim) milk. Fat-free, low-fat, or reduced-fat cheeses. Nonfat, low-sodium ricotta or cottage cheese. Low-fat or nonfat yogurt. Low-fat, low-sodium cheese. Fats and oils Soft margarine without trans fats. Vegetable oil. Low-fat, reduced-fat, or light mayonnaise and salad dressings (reduced-sodium). Canola, safflower, olive, soybean, and sunflower oils. Avocado. Seasoning and other foods Herbs. Spices. Seasoning mixes without salt. Unsalted popcorn and pretzels. Fat-free sweets. What foods are not recommended? The items listed may not be a complete list. Talk with your dietitian about what dietary choices are best for you. Grains Baked goods made with fat, such as croissants, muffins, or some breads. Dry pasta or rice meal packs. Vegetables Creamed or fried vegetables. Vegetables in a cheese sauce. Regular canned vegetables (not low-sodium or reduced-sodium). Regular canned tomato sauce and paste (not low-sodium or reduced-sodium). Regular tomato and vegetable juice (not low-sodium or reduced-sodium). Pickles. Olives. Fruits Canned fruit in a light or heavy syrup. Fried fruit. Fruit in cream or butter sauce. Meat and other protein foods Fatty cuts of meat. Ribs. Fried meat. Bacon. Sausage. Bologna and other processed lunch meats. Salami. Fatback. Hotdogs. Bratwurst. Salted nuts and seeds. Canned beans with added salt. Canned or smoked fish. Whole eggs or egg yolks. Chicken or turkey with skin. Dairy Whole or 2% milk, cream, and half-and-half. Whole or full-fat cream cheese. Whole-fat or sweetened yogurt. Full-fat cheese. Nondairy creamers. Whipped toppings.  Processed cheese and cheese spreads. Fats and oils Butter. Stick margarine. Lard. Shortening. Ghee. Bacon fat. Tropical oils, such as coconut, palm kernel, or palm oil. Seasoning and other foods Salted popcorn and pretzels. Onion salt, garlic salt, seasoned salt, table salt, and sea salt. Worcestershire sauce. Tartar sauce. Barbecue sauce. Teriyaki sauce. Soy sauce, including reduced-sodium. Steak sauce. Canned and packaged gravies. Fish sauce. Oyster sauce. Cocktail sauce. Horseradish that you find on the shelf. Ketchup. Mustard. Meat flavorings and tenderizers. Bouillon cubes. Hot sauce and Tabasco sauce. Premade or packaged marinades. Premade or packaged taco seasonings. Relishes. Regular salad dressings. Where to find more information:  National Heart, Lung, and Blood Institute: www.nhlbi.nih.gov  American Heart Association: www.heart.org Summary  The DASH eating plan is a healthy eating plan that has been shown to reduce high blood pressure (hypertension). It may also reduce your risk for type 2 diabetes, heart disease, and stroke.  With the DASH eating plan, you should limit salt (sodium) intake to 2,300 mg a day. If you have hypertension, you may need to reduce your sodium intake to 1,500 mg a day.  When on the DASH eating plan, aim to eat more fresh fruits and vegetables, whole grains, lean proteins, low-fat dairy, and heart-healthy fats.  Work with your health care provider or diet and nutrition specialist (dietitian) to adjust your eating plan to your individual   calorie needs. This information is not intended to replace advice given to you by your health care provider. Make sure you discuss any questions you have with your health care provider. Document Released: 10/02/2011 Document Revised: 10/06/2016 Document Reviewed: 10/06/2016 Elsevier Interactive Patient Education  2018 Elsevier Inc.  

## 2018-05-01 LAB — CMP14+EGFR
A/G RATIO: 1.8 (ref 1.2–2.2)
ALT: 24 IU/L (ref 0–44)
AST: 16 IU/L (ref 0–40)
Albumin: 4.3 g/dL (ref 3.5–4.8)
Alkaline Phosphatase: 64 IU/L (ref 39–117)
BUN/Creatinine Ratio: 12 (ref 10–24)
BUN: 18 mg/dL (ref 8–27)
Bilirubin Total: 0.4 mg/dL (ref 0.0–1.2)
CALCIUM: 9.5 mg/dL (ref 8.6–10.2)
CHLORIDE: 105 mmol/L (ref 96–106)
CO2: 22 mmol/L (ref 20–29)
Creatinine, Ser: 1.48 mg/dL — ABNORMAL HIGH (ref 0.76–1.27)
GFR calc Af Amer: 52 mL/min/{1.73_m2} — ABNORMAL LOW (ref >59)
GFR calc non Af Amer: 45 mL/min/{1.73_m2} — ABNORMAL LOW (ref >59)
GLUCOSE: 161 mg/dL — AB (ref 65–99)
Globulin, Total: 2.4 g/dL (ref 1.5–4.5)
POTASSIUM: 4.3 mmol/L (ref 3.5–5.2)
Sodium: 143 mmol/L (ref 134–144)
Total Protein: 6.7 g/dL (ref 6.0–8.5)

## 2018-05-01 LAB — LIPID PANEL
Chol/HDL Ratio: 4 ratio (ref 0.0–5.0)
Cholesterol, Total: 120 mg/dL (ref 100–199)
HDL: 30 mg/dL — AB (ref >39)
LDL Calculated: 52 mg/dL (ref 0–99)
TRIGLYCERIDES: 190 mg/dL — AB (ref 0–149)
VLDL CHOLESTEROL CAL: 38 mg/dL (ref 5–40)

## 2018-05-01 LAB — PSA, TOTAL AND FREE
PSA FREE PCT: 18.9 %
PSA FREE: 0.17 ng/mL
Prostate Specific Ag, Serum: 0.9 ng/mL (ref 0.0–4.0)

## 2018-05-31 ENCOUNTER — Other Ambulatory Visit: Payer: Self-pay | Admitting: Nurse Practitioner

## 2018-05-31 DIAGNOSIS — G629 Polyneuropathy, unspecified: Secondary | ICD-10-CM

## 2018-05-31 DIAGNOSIS — I10 Essential (primary) hypertension: Secondary | ICD-10-CM

## 2018-05-31 DIAGNOSIS — E785 Hyperlipidemia, unspecified: Secondary | ICD-10-CM

## 2018-05-31 DIAGNOSIS — R609 Edema, unspecified: Secondary | ICD-10-CM

## 2018-06-01 NOTE — Telephone Encounter (Signed)
OV 04/30/18 RTC 6mos

## 2018-06-02 DIAGNOSIS — Z0289 Encounter for other administrative examinations: Secondary | ICD-10-CM

## 2018-08-26 ENCOUNTER — Other Ambulatory Visit: Payer: Self-pay | Admitting: Nurse Practitioner

## 2018-08-26 DIAGNOSIS — J410 Simple chronic bronchitis: Secondary | ICD-10-CM

## 2018-10-19 ENCOUNTER — Other Ambulatory Visit: Payer: Self-pay | Admitting: Nurse Practitioner

## 2018-10-19 DIAGNOSIS — G629 Polyneuropathy, unspecified: Secondary | ICD-10-CM

## 2018-10-21 ENCOUNTER — Other Ambulatory Visit: Payer: Self-pay | Admitting: Nurse Practitioner

## 2018-10-21 DIAGNOSIS — E785 Hyperlipidemia, unspecified: Secondary | ICD-10-CM

## 2018-10-21 DIAGNOSIS — I10 Essential (primary) hypertension: Secondary | ICD-10-CM

## 2018-10-21 DIAGNOSIS — R609 Edema, unspecified: Secondary | ICD-10-CM

## 2018-10-21 NOTE — Telephone Encounter (Signed)
Last seen 04/30/18

## 2018-10-22 NOTE — Telephone Encounter (Signed)
Last seen 04/30/18

## 2018-11-23 ENCOUNTER — Other Ambulatory Visit: Payer: Self-pay | Admitting: Nurse Practitioner

## 2018-11-23 NOTE — Telephone Encounter (Signed)
Last seen 05/04/18 

## 2018-12-12 ENCOUNTER — Other Ambulatory Visit: Payer: Self-pay | Admitting: Nurse Practitioner

## 2019-01-17 ENCOUNTER — Other Ambulatory Visit: Payer: Self-pay | Admitting: Nurse Practitioner

## 2019-01-17 DIAGNOSIS — E785 Hyperlipidemia, unspecified: Secondary | ICD-10-CM

## 2019-01-17 DIAGNOSIS — R609 Edema, unspecified: Secondary | ICD-10-CM

## 2019-01-17 DIAGNOSIS — G629 Polyneuropathy, unspecified: Secondary | ICD-10-CM

## 2019-01-17 DIAGNOSIS — I10 Essential (primary) hypertension: Secondary | ICD-10-CM

## 2019-01-17 DIAGNOSIS — R600 Localized edema: Secondary | ICD-10-CM

## 2019-03-14 ENCOUNTER — Other Ambulatory Visit: Payer: Self-pay | Admitting: Nurse Practitioner

## 2019-03-14 DIAGNOSIS — E785 Hyperlipidemia, unspecified: Secondary | ICD-10-CM

## 2019-03-14 DIAGNOSIS — R609 Edema, unspecified: Secondary | ICD-10-CM

## 2019-03-14 DIAGNOSIS — J410 Simple chronic bronchitis: Secondary | ICD-10-CM

## 2019-03-14 DIAGNOSIS — G629 Polyneuropathy, unspecified: Secondary | ICD-10-CM

## 2019-03-15 NOTE — Telephone Encounter (Signed)
MMM. NTBS LOV 04/30/18

## 2019-03-15 NOTE — Telephone Encounter (Signed)
Son aware ntbs and will call back to schedule.

## 2019-03-17 ENCOUNTER — Ambulatory Visit: Payer: Medicare HMO | Admitting: Nurse Practitioner

## 2019-04-07 ENCOUNTER — Ambulatory Visit (INDEPENDENT_AMBULATORY_CARE_PROVIDER_SITE_OTHER): Payer: Medicare HMO | Admitting: Nurse Practitioner

## 2019-04-07 ENCOUNTER — Other Ambulatory Visit: Payer: Self-pay

## 2019-04-07 ENCOUNTER — Encounter: Payer: Self-pay | Admitting: Nurse Practitioner

## 2019-04-07 DIAGNOSIS — R609 Edema, unspecified: Secondary | ICD-10-CM | POA: Diagnosis not present

## 2019-04-07 DIAGNOSIS — I10 Essential (primary) hypertension: Secondary | ICD-10-CM

## 2019-04-07 DIAGNOSIS — E785 Hyperlipidemia, unspecified: Secondary | ICD-10-CM

## 2019-04-07 DIAGNOSIS — Z6834 Body mass index (BMI) 34.0-34.9, adult: Secondary | ICD-10-CM

## 2019-04-07 DIAGNOSIS — J410 Simple chronic bronchitis: Secondary | ICD-10-CM

## 2019-04-07 DIAGNOSIS — G629 Polyneuropathy, unspecified: Secondary | ICD-10-CM

## 2019-04-07 MED ORDER — FUROSEMIDE 20 MG PO TABS: 20.0000 mg | ORAL_TABLET | Freq: Every day | ORAL | 1 refills | Status: DC

## 2019-04-07 MED ORDER — FLUTICASONE-SALMETEROL 250-50 MCG/DOSE IN AEPB: INHALATION_SPRAY | RESPIRATORY_TRACT | 1 refills | Status: DC

## 2019-04-07 MED ORDER — GABAPENTIN 300 MG PO CAPS: 300.0000 mg | ORAL_CAPSULE | Freq: Every day | ORAL | 1 refills | Status: DC

## 2019-04-07 MED ORDER — LISINOPRIL 40 MG PO TABS: 40.0000 mg | ORAL_TABLET | Freq: Every day | ORAL | 1 refills | Status: DC

## 2019-04-07 MED ORDER — ATORVASTATIN CALCIUM 20 MG PO TABS: 20.0000 mg | ORAL_TABLET | Freq: Every evening | ORAL | 1 refills | Status: DC

## 2019-04-07 MED ORDER — MAGNESIUM OXIDE 400 MG PO TABS: 1.0000 | ORAL_TABLET | Freq: Every day | ORAL | 1 refills | Status: DC

## 2019-04-07 NOTE — Progress Notes (Signed)
Virtual Visit via telephone Note  I connected with Neil Smith on 04/07/19 at 4:40 by telephone and verified that I am speaking with the correct person using two identifiers. Neil Smith is currently located at home  and son and daughter in law is currently with her during visit. The provider, Mary-Margaret Hassell Done, FNP is located in their office at time of visit.  I discussed the limitations, risks, security and privacy concerns of performing an evaluation and management service by telephone and the availability of in person appointments. I also discussed with the patient that there may be a patient responsible charge related to this service. The patient expressed understanding and agreed to proceed.   History and Present Illness:   Chief Complaint: Medical Management of Chronic Issues    HPI:  1. Essential hypertension No c/o chest pain, sob or headache. Does not check blood pressure at home. BP Readings from Last 3 Encounters:  04/30/18 113/61  10/14/17 (!) 145/76  04/02/17 137/63     2. Hyperlipidemia with target LDL less than 100 Does not watch diet and does very liittle exercise  3. Simple chronic bronchitis ([place]) Uses advair daily and only gets sob when outside in the heat.  4. Neuropathy Has occassionally burning in feet. Soaks them in hot water and that seems tyo help. Neil Smith is on gabapentin which helps.  5. Peripheral edema Has very little edema- wears ocmpression socks.  6. BMI 34.0-34.9,adult No recent weight changes    Outpatient Encounter Medications as of 04/07/2019  Medication Sig  . ADVAIR DISKUS 250-50 MCG/DOSE AEPB TAKE 1 PUFF BY MOUTH TWICE A DAY  . albuterol (PROVENTIL HFA;VENTOLIN HFA) 108 (90 Base) MCG/ACT inhaler TAKE 2 PUFFS BY MOUTH EVERY 6 HOURS AS NEEDED FOR WHEEZE OR SHORTNESS OF BREATH  . atorvastatin (LIPITOR) 20 MG tablet TAKE 1 TABLET BY MOUTH EVERY DAY IN THE EVENING  . furosemide (LASIX) 20 MG tablet TAKE 1 TABLET BY MOUTH  EVERY DAY  . gabapentin (NEURONTIN) 300 MG capsule TAKE 1 CAPSULE BY MOUTH EVERYDAY AT BEDTIME  . lisinopril (PRINIVIL,ZESTRIL) 40 MG tablet TAKE 1 TABLET BY MOUTH EVERY DAY  . loratadine (CLARITIN) 10 MG tablet Take 10 mg by mouth daily.  . magnesium oxide (MAG-OX) 400 MG tablet TAKE 1 TABLET BY MOUTH EVERY DAY      Family History  Problem Relation Age of Onset  . Dementia Mother 61  . Heart attack Brother   . Hyperlipidemia Son   . Heart attack Brother   . Alcohol abuse Brother   . Cancer Brother 48       Lung  . Hyperlipidemia Son     New complaints: None today  Social history: Lives by hisself and family checks on him daily     Review of Systems  Constitutional: Negative for diaphoresis and weight loss.  Eyes: Negative for blurred vision, double vision and pain.  Respiratory: Negative for shortness of breath.   Cardiovascular: Negative for chest pain, palpitations, orthopnea and leg swelling.  Gastrointestinal: Negative for abdominal pain.  Skin: Negative for rash.  Neurological: Negative for dizziness, sensory change, loss of consciousness, weakness and headaches.  Endo/Heme/Allergies: Negative for polydipsia. Does not bruise/bleed easily.  Psychiatric/Behavioral: Negative for memory loss. The patient does not have insomnia.   All other systems reviewed and are negative.    Observations/Objective: Alert and oriented- answers all questions appropriately No distress  Assessment and Plan: Neil Smith comes in today with chief complaint of Medical  Management of Chronic Issues   Diagnosis and orders addressed:  1. Essential hypertension Low sodium diet - lisinopril (ZESTRIL) 40 MG tablet; Take 1 tablet (40 mg total) by mouth daily.  Dispense: 90 tablet; Refill: 1  2. Hyperlipidemia with target LDL less than 100 Low fat diet - atorvastatin (LIPITOR) 20 MG tablet; Take 1 tablet (20 mg total) by mouth every evening.  Dispense: 90 tablet; Refill: 1  3.  Simple chronic bronchitis (HCC) Avoid going out in hot weather - Fluticasone-Salmeterol (ADVAIR DISKUS) 250-50 MCG/DOSE AEPB; TAKE 1 PUFF BY MOUTH TWICE A DAY  Dispense: 180 each; Refill: 1  4. Neuropathy Do not go barefooted Check feet daily - gabapentin (NEURONTIN) 300 MG capsule; Take 1 capsule (300 mg total) by mouth at bedtime.  Dispense: 90 capsule; Refill: 1  5. Peripheral edema Elevate legs when sitting Continue wearing compression socks - magnesium oxide (MAG-OX) 400 MG tablet; Take 1 tablet (400 mg total) by mouth daily.  Dispense: 90 tablet; Refill: 1 - furosemide (LASIX) 20 MG tablet; Take 1 tablet (20 mg total) by mouth daily.  Dispense: 90 tablet; Refill: 1  6. BMI 34.0-34.9,adult Discussed diet and exercise for person with BMI >25 Will recheck weight in 3-6 months   Labs pending Health Maintenance reviewed Diet and exercise encouraged  Follow up plan: 3 months      I discussed the assessment and treatment plan with the patient. The patient was provided an opportunity to ask questions and all were answered. The patient agreed with the plan and demonstrated an understanding of the instructions.   The patient was advised to call back or seek an in-person evaluation if the symptoms worsen or if the condition fails to improve as anticipated.  The above assessment and management plan was discussed with the patient. The patient verbalized understanding of and has agreed to the management plan. Patient is aware to call the clinic if symptoms persist or worsen. Patient is aware when to return to the clinic for a follow-up visit. Patient educated on when it is appropriate to go to the emergency department.   Time call ended:  4:55  I provided 15 minutes of non-face-to-face time during this encounter.    Mary-Margaret Daphine DeutscherMartin, FNP

## 2019-05-23 ENCOUNTER — Telehealth: Payer: Self-pay | Admitting: Nurse Practitioner

## 2019-05-23 NOTE — Chronic Care Management (AMB) (Signed)
Chronic Care Management   Note  05/23/2019 Name: Neil Smith MRN: [id number] DOB: 08-Mar-1941  Neil Smith is a 77 y.o. year old male who is a primary care patient of Chevis Pretty, [place]. I reached out to [name] by phone today in response to a referral sent by Mr. Neil Goodell Mcqu[place]kin's health plan.    Neil Smith was given information about Chronic Care Management services today including:  1. CCM service includes personalized support from designated clinical staff supervised by his physician, including individualized plan of care and coordination with other care providers 2. 24/7 contact phone numbers for assistance for urgent and routine care needs. 3. Service will only be billed when office clinical staff spend 20 minutes or more in a month to coordinate care. 4. Only one practitioner may furnish and bill the service in a calendar month. 5. The patient may stop CCM services at any time (effective at the end of the month) by phone call to the office staff. 6. The patient will be responsible for cost sharing (co-pay) of up to 20% of the service fee (after annual deductible is met).  Patient did not agree to enrollment in care management services and does not wish to consider at this time.  Follow up plan: The patient has been provided with contact information for the chronic care management team and has been advised to call with any health related questions or concerns.   [place]  [email or web address]   ??[phone]

## 2019-07-18 ENCOUNTER — Ambulatory Visit (INDEPENDENT_AMBULATORY_CARE_PROVIDER_SITE_OTHER): Payer: Medicare HMO | Admitting: Nurse Practitioner

## 2019-07-18 ENCOUNTER — Encounter: Payer: Self-pay | Admitting: Nurse Practitioner

## 2019-07-18 DIAGNOSIS — Z6834 Body mass index (BMI) 34.0-34.9, adult: Secondary | ICD-10-CM

## 2019-07-18 DIAGNOSIS — R609 Edema, unspecified: Secondary | ICD-10-CM

## 2019-07-18 DIAGNOSIS — I10 Essential (primary) hypertension: Secondary | ICD-10-CM

## 2019-07-18 DIAGNOSIS — J410 Simple chronic bronchitis: Secondary | ICD-10-CM

## 2019-07-18 DIAGNOSIS — G629 Polyneuropathy, unspecified: Secondary | ICD-10-CM

## 2019-07-18 DIAGNOSIS — E785 Hyperlipidemia, unspecified: Secondary | ICD-10-CM | POA: Diagnosis not present

## 2019-07-18 MED ORDER — MAGNESIUM OXIDE 400 MG PO TABS: 1.0000 | ORAL_TABLET | Freq: Every day | ORAL | 1 refills | Status: DC

## 2019-07-18 MED ORDER — LISINOPRIL 40 MG PO TABS: 40.0000 mg | ORAL_TABLET | Freq: Every day | ORAL | 1 refills | Status: DC

## 2019-07-18 MED ORDER — ATORVASTATIN CALCIUM 20 MG PO TABS: 20.0000 mg | ORAL_TABLET | Freq: Every evening | ORAL | 1 refills | Status: DC

## 2019-07-18 MED ORDER — GABAPENTIN 300 MG PO CAPS: 300.0000 mg | ORAL_CAPSULE | Freq: Every day | ORAL | 1 refills | Status: DC

## 2019-07-18 MED ORDER — FUROSEMIDE 20 MG PO TABS: 20.0000 mg | ORAL_TABLET | Freq: Every day | ORAL | 1 refills | Status: DC

## 2019-07-18 MED ORDER — FLUTICASONE-SALMETEROL 250-50 MCG/DOSE IN AEPB: INHALATION_SPRAY | RESPIRATORY_TRACT | 1 refills | Status: DC

## 2019-07-18 NOTE — Progress Notes (Signed)
Virtual Visit via telephone Note Due to COVID-19 pandemic this visit was conducted virtually. This visit type was conducted due to national recommendations for restrictions regarding the COVID-19 Pandemic (e.g. social distancing, sheltering in place) in an effort to limit this patient's exposure and mitigate transmission in our community. All issues noted in this document were discussed and addressed.  A physical exam was not performed with this format.  I connected with Neil Smith on 07/18/19 at 3:55 by telephone and verified that I am speaking with the correct person using two identifiers. Neil Smith is currently located at home and his son is currently with h during visit. The provider, Mary-Margaret Daphine Deutscher, FNP is located in their office at time of visit.  I discussed the limitations, risks, security and privacy concerns of performing an evaluation and management service by telephone and the availability of in person appointments. I also discussed with the patient that there may be a patient responsible charge related to this service. The patient expressed understanding and agreed to proceed.   History and Present Illness:   Chief Complaint: Medical Management of Chronic Issues    HPI:  1. Essential hypertension No c/o chest pain, sob or headache. He does not check blood pressure at home. BP Readings from Last 3 Encounters:  04/30/18 113/61  10/14/17 (!) 145/76  04/02/17 137/63     2. Hyperlipidemia with target LDL less than 100 Does not watch diet and does no dedicated exercise. Lab Results  Component Value Date   CHOL 120 04/30/2018   HDL 30 (L) 04/30/2018   LDLCALC 52 04/30/2018   TRIG 190 (H) 04/30/2018   CHOLHDL 4.0 04/30/2018     3. Simple chronic bronchitis (HCC) Uses his advair daily and is doing well. He has not needed to use his albuterol in several weeks.  4. Neuropathy Has constant burning on bil feet. The neurontin he takes in mornings and  says his feet burn at night  5. Peripheral edema Has swelling daily but elevating legs really helps  6. BMI 34.0-34.9,adult No recent weight chanages  Wt Readings from Last 3 Encounters:  04/30/18 254 lb (115.2 kg)  10/14/17 252 lb (114.3 kg)  04/02/17 249 lb (112.9 kg)   BMI Readings from Last 3 Encounters:  04/30/18 36.45 kg/m  10/14/17 36.16 kg/m  04/02/17 33.77 kg/m     Outpatient Encounter Medications as of 07/18/2019  Medication Sig  . albuterol (PROVENTIL HFA;VENTOLIN HFA) 108 (90 Base) MCG/ACT inhaler TAKE 2 PUFFS BY MOUTH EVERY 6 HOURS AS NEEDED FOR WHEEZE OR SHORTNESS OF BREATH  . atorvastatin (LIPITOR) 20 MG tablet Take 1 tablet (20 mg total) by mouth every evening.  . Fluticasone-Salmeterol (ADVAIR DISKUS) 250-50 MCG/DOSE AEPB TAKE 1 PUFF BY MOUTH TWICE A DAY  . furosemide (LASIX) 20 MG tablet Take 1 tablet (20 mg total) by mouth daily.  Marland Kitchen gabapentin (NEURONTIN) 300 MG capsule Take 1 capsule (300 mg total) by mouth at bedtime.  Marland Kitchen lisinopril (ZESTRIL) 40 MG tablet Take 1 tablet (40 mg total) by mouth daily.  Marland Kitchen loratadine (CLARITIN) 10 MG tablet Take 10 mg by mouth daily.  . magnesium oxide (MAG-OX) 400 MG tablet Take 1 tablet (400 mg total) by mouth daily.    History reviewed. No pertinent surgical history.  Family History  Problem Relation Age of Onset  . Dementia Mother 70  . Heart attack Brother   . Hyperlipidemia Son   . Heart attack Brother   . Alcohol abuse Brother   .  Cancer Brother 48       Lung  . Hyperlipidemia Son     New complaints: None today  Social history: Lives by hisself but his family takes care of him  Controlled substance contract: n/a    Review of Systems  Constitutional: Negative.  Negative for diaphoresis and weight loss.  Eyes: Negative for blurred vision, double vision and pain.  Respiratory: Negative for shortness of breath.   Cardiovascular: Negative for chest pain, palpitations, orthopnea and leg swelling.   Gastrointestinal: Negative for abdominal pain.  Skin: Negative for rash.  Neurological: Negative for dizziness, sensory change, loss of consciousness, weakness and headaches.       Burning sensation bil feet  Endo/Heme/Allergies: Negative for polydipsia. Does not bruise/bleed easily.  Psychiatric/Behavioral: Negative for memory loss. The patient does not have insomnia.   All other systems reviewed and are negative.    Observations/Objective: Alert and oriented- answers all questions appropriately No distress    Assessment and Plan: Neil Smith comes in today with chief complaint of Medical Management of Chronic Issues   Diagnosis and orders addressed:  1. Essential hypertension Low sodium diet - lisinopril (ZESTRIL) 40 MG tablet; Take 1 tablet (40 mg total) by mouth daily.  Dispense: 90 tablet; Refill: 1  2. Hyperlipidemia with target LDL less than 100 Low fat diet - atorvastatin (LIPITOR) 20 MG tablet; Take 1 tablet (20 mg total) by mouth every evening.  Dispense: 90 tablet; Refill: 1  3. Simple chronic bronchitis (HCC) Continue to only use albuterol as needed - Fluticasone-Salmeterol (ADVAIR DISKUS) 250-50 MCG/DOSE AEPB; TAKE 1 PUFF BY MOUTH TWICE A DAY  Dispense: 180 each; Refill: 1  4. Neuropathy Change and take neurontin at night We may need to make neurontin BID rather than daily - gabapentin (NEURONTIN) 300 MG capsule; Take 1 capsule (300 mg total) by mouth at bedtime.  Dispense: 90 capsule; Refill: 1  5. Peripheral edema Elevate legs when sitting - furosemide (LASIX) 20 MG tablet; Take 1 tablet (20 mg total) by mouth daily.  Dispense: 90 tablet; Refill: 1 - magnesium oxide (MAG-OX) 400 MG tablet; Take 1 tablet (400 mg total) by mouth daily.  Dispense: 90 tablet; Refill: 1  6. BMI 34.0-34.9,adult Discussed diet and exercise for person with BMI >25 Will recheck weight in 3-6 months   Labs pending Health Maintenance reviewed Diet and exercise encouraged   Follow up plan: 4 months     I discussed the assessment and treatment plan with the patient. The patient was provided an opportunity to ask questions and all were answered. The patient agreed with the plan and demonstrated an understanding of the instructions.   The patient was advised to call back or seek an in-person evaluation if the symptoms worsen or if the condition fails to improve as anticipated.  The above assessment and management plan was discussed with the patient. The patient verbalized understanding of and has agreed to the management plan. Patient is aware to call the clinic if symptoms persist or worsen. Patient is aware when to return to the clinic for a follow-up visit. Patient educated on when it is appropriate to go to the emergency department.   Time call ended:  4:10 I provided 15 minutes of non-face-to-face time during this encounter.    Mary-Margaret Hassell Done, FNP

## 2019-10-27 ENCOUNTER — Other Ambulatory Visit: Payer: Self-pay | Admitting: Family Medicine

## 2019-10-27 DIAGNOSIS — J441 Chronic obstructive pulmonary disease with (acute) exacerbation: Secondary | ICD-10-CM

## 2019-10-27 MED ORDER — PREDNISONE 20 MG PO TABS: ORAL_TABLET | ORAL | 0 refills | Status: DC

## 2019-10-30 ENCOUNTER — Other Ambulatory Visit: Payer: Self-pay

## 2019-10-30 ENCOUNTER — Emergency Department (HOSPITAL_COMMUNITY): Payer: Medicare HMO

## 2019-10-30 ENCOUNTER — Emergency Department (HOSPITAL_COMMUNITY)
Admission: EM | Admit: 2019-10-30 | Discharge: 2019-10-30 | Disposition: A | Discharge status: Home / Self Care | Payer: Medicare HMO | Attending: Emergency Medicine | Admitting: Emergency Medicine

## 2019-10-30 ENCOUNTER — Encounter (HOSPITAL_COMMUNITY): Payer: Self-pay | Admitting: Emergency Medicine

## 2019-10-30 DIAGNOSIS — U071 COVID-19: Secondary | ICD-10-CM | POA: Diagnosis not present

## 2019-10-30 DIAGNOSIS — I10 Essential (primary) hypertension: Secondary | ICD-10-CM | POA: Insufficient documentation

## 2019-10-30 DIAGNOSIS — Z79899 Other long term (current) drug therapy: Secondary | ICD-10-CM | POA: Insufficient documentation

## 2019-10-30 DIAGNOSIS — R062 Wheezing: Secondary | ICD-10-CM | POA: Diagnosis not present

## 2019-10-30 DIAGNOSIS — J441 Chronic obstructive pulmonary disease with (acute) exacerbation: Secondary | ICD-10-CM | POA: Diagnosis not present

## 2019-10-30 DIAGNOSIS — R000 Tachycardia, unspecified: Secondary | ICD-10-CM | POA: Diagnosis not present

## 2019-10-30 DIAGNOSIS — R0602 Shortness of breath: Secondary | ICD-10-CM | POA: Diagnosis not present

## 2019-10-30 DIAGNOSIS — Z87891 Personal history of nicotine dependence: Secondary | ICD-10-CM | POA: Insufficient documentation

## 2019-10-30 DIAGNOSIS — I4510 Unspecified right bundle-branch block: Secondary | ICD-10-CM | POA: Diagnosis not present

## 2019-10-30 LAB — CBC
HCT: 44.4 % (ref 39.0–52.0)
Hemoglobin: 14.5 g/dL (ref 13.0–17.0)
MCH: 30.7 pg (ref 26.0–34.0)
MCHC: 32.7 g/dL (ref 30.0–36.0)
MCV: 94.1 fL (ref 80.0–100.0)
Platelets: 292 10*3/uL (ref 150–400)
RBC: 4.72 MIL/uL (ref 4.22–5.81)
RDW: 12.9 % (ref 11.5–15.5)
WBC: 13.1 10*3/uL — ABNORMAL HIGH (ref 4.0–10.5)
nRBC: 0 % (ref 0.0–0.2)

## 2019-10-30 LAB — TROPONIN I (HIGH SENSITIVITY): Troponin I (High Sensitivity): 3 ng/L (ref <18)

## 2019-10-30 LAB — BASIC METABOLIC PANEL
Anion gap: 13 (ref 5–15)
BUN: 51 mg/dL — ABNORMAL HIGH (ref 8–23)
CO2: 18 mmol/L — ABNORMAL LOW (ref 22–32)
Calcium: 8.5 mg/dL — ABNORMAL LOW (ref 8.9–10.3)
Chloride: 104 mmol/L (ref 98–111)
Creatinine, Ser: 1.8 mg/dL — ABNORMAL HIGH (ref 0.61–1.24)
GFR calc Af Amer: 41 mL/min — ABNORMAL LOW (ref >60)
GFR calc non Af Amer: 35 mL/min — ABNORMAL LOW (ref >60)
Glucose, Bld: 153 mg/dL — ABNORMAL HIGH (ref 70–99)
Potassium: 5.1 mmol/L (ref 3.5–5.1)
Sodium: 135 mmol/L (ref 135–145)

## 2019-10-30 LAB — POC SARS CORONAVIRUS 2 AG -  ED: SARS Coronavirus 2 Ag: POSITIVE — AB

## 2019-10-30 MED ORDER — BENZONATATE 100 MG PO CAPS: 100.0000 mg | ORAL_CAPSULE | Freq: Three times a day (TID) | ORAL | 0 refills | Status: DC | PRN

## 2019-10-30 MED ORDER — IPRATROPIUM BROMIDE HFA 17 MCG/ACT IN AERS
2.0000 | INHALATION_SPRAY | Freq: Once | RESPIRATORY_TRACT | Status: AC
  Administered 2019-10-30: 2 via RESPIRATORY_TRACT
  Filled 2019-10-30: qty 12.9

## 2019-10-30 MED ORDER — MAGNESIUM SULFATE 2 GM/50ML IV SOLN
2.0000 g | Freq: Once | INTRAVENOUS | Status: AC
  Administered 2019-10-30: 2 g via INTRAVENOUS
  Filled 2019-10-30: qty 50

## 2019-10-30 MED ORDER — ALBUTEROL SULFATE HFA 108 (90 BASE) MCG/ACT IN AERS
8.0000 | INHALATION_SPRAY | Freq: Once | RESPIRATORY_TRACT | Status: AC
  Administered 2019-10-30: 8 via RESPIRATORY_TRACT
  Filled 2019-10-30: qty 6.7

## 2019-10-30 NOTE — Discharge Instructions (Addendum)
Continue taking the steroids as prescribed by your doctor. Use the albuterol inhaler (2 puss) every 4 hours for the next 2 days.  After this, use as needed for shortness of breath or cough. Take tessalon as needed for cough.  Use Tylenol or ibuprofen as needed for fever or body aches. Closely monitor your breathing, if it worsens you should come to the emergency room for reevaluation. Return to the emergency room if any new, worsening, concerning symptoms.

## 2019-10-30 NOTE — ED Notes (Signed)
Assisted pt to walk in room on RA. SpO2 remained 89% or greater during this activity. Pt reports his baseline amount of dyspnea with this activity. SpO2 returned to 93% RA c rest

## 2019-10-30 NOTE — ED Provider Notes (Signed)
[facility] EMERGENCY DEPARTMENT Provider Note   CSN: [id number] Arrival date & time: 10/30/19  1228     History Chief Complaint  Patient presents with  . Shortness of Breath    Neil Smith is a 79 y.o. male presenting for evaluation of shortness of breath.  Patient states he has been more short of breath the past couple days.  He states it feels like a COPD exacerbation.  He reports mild cough, but no different from normal.  He denies fevers, chills, chest pain, nausea, vomiting, dental pain, urinary symptoms, abnormal bowel movements.  He states he does not wear oxygen.  He states he had multiple family members who were sick with Covid several months ago, but none currently.  Additional history obtained from chart review.  Per triage note, patient has been staying with his daughter and does not have his home medications including his inhalers.  He was found with EMS to be 88% on room air, this improved with 4 L of oxygen.  He received duo nebs and Solu-Medrol with EMS.  HPI     Past Medical History:  Diagnosis Date  . COPD (chronic obstructive pulmonary disease) (HCC)   . Hyperlipidemia   . Hypertension     Patient Active Problem List   Diagnosis Date Noted  . Peripheral edema 04/30/2018  . BMI 34.0-34.9,adult 05/09/2015  . Hyperlipidemia with target LDL less than 100 07/18/2014  . Neuropathy 07/18/2014  . COPD (chronic obstructive pulmonary disease) (HCC) 01/09/2014  . Hypertension 01/09/2014    History reviewed. No pertinent surgical history.     Family History  Problem Relation Age of Onset  . Dementia Mother 21  . Heart attack Brother   . Hyperlipidemia Son   . Heart attack Brother   . Alcohol abuse Brother   . Cancer Brother 48       Lung  . Hyperlipidemia Son     Social History   Tobacco Use  . Smoking status: Former Smoker    Types: Cigarettes    Quit date: 10/14/2002    Years since quitting: 17.0  . Smokeless tobacco:  Never Used  Substance Use Topics  . Alcohol use: No  . Drug use: No    Home Medications Prior to Admission medications   Medication Sig Start Date End Date Taking? Authorizing Provider  albuterol (PROVENTIL HFA;VENTOLIN HFA) 108 (90 Base) MCG/ACT inhaler TAKE 2 PUFFS BY MOUTH EVERY 6 HOURS AS NEEDED FOR WHEEZE OR SHORTNESS OF BREATH 01/17/19   Daphine Deutscher, Mary-Margaret, FNP  atorvastatin (LIPITOR) 20 MG tablet Take 1 tablet (20 mg total) by mouth every evening. 07/18/19   Daphine Deutscher, Mary-Margaret, FNP  benzonatate (TESSALON) 100 MG capsule Take 1 capsule (100 mg total) by mouth 3 (three) times daily as needed for cough. 10/30/19   Yon Schiffman, PA-C  Fluticasone-Salmeterol (ADVAIR DISKUS) 250-50 MCG/DOSE AEPB TAKE 1 PUFF BY MOUTH TWICE A DAY 07/18/19   Daphine Deutscher, Mary-Margaret, FNP  furosemide (LASIX) 20 MG tablet Take 1 tablet (20 mg total) by mouth daily. 07/18/19   Daphine Deutscher, Mary-Margaret, FNP  gabapentin (NEURONTIN) 300 MG capsule Take 1 capsule (300 mg total) by mouth at bedtime. 07/18/19   Daphine Deutscher, Mary-Margaret, FNP  lisinopril (ZESTRIL) 40 MG tablet Take 1 tablet (40 mg total) by mouth daily. 07/18/19   Daphine Deutscher, Mary-Margaret, FNP  loratadine (CLARITIN) 10 MG tablet Take 10 mg by mouth daily.    [provider]  magnesium oxide (MAG-OX) 400 MG tablet Take 1 tablet (400 mg  total) by mouth daily. 07/18/19   Daphine Deutscher Mary-Margaret, FNP  predniSONE (DELTASONE) 20 MG tablet 2 po at sametime daily for 5 days 10/27/19   Sonny Masters, FNP    Allergies    Patient has no known allergies.  Review of Systems   Review of Systems  Respiratory: Positive for cough and shortness of breath.   All other systems reviewed and are negative.   Physical Exam Updated Vital Signs BP 125/74   Pulse 94   Temp 98 F (36.7 C) (Oral)   Resp (!) 22   SpO2 92%   Physical Exam Vitals and nursing note reviewed.  Constitutional:      General: He is not in acute distress.    Appearance: He is well-developed.       Comments: Resting comfortably in the bed in no acute distress  HENT:     Head: Normocephalic and atraumatic.  Eyes:     Conjunctiva/sclera: Conjunctivae normal.     Pupils: Pupils are equal, round, and reactive to light.  Cardiovascular:     Rate and Rhythm: Normal rate and regular rhythm.  Pulmonary:     Effort: Pulmonary effort is normal. No respiratory distress.     Breath sounds: Decreased breath sounds and wheezing present.     Comments: Diminished lung sounds throughout.  Scattered wheezing.  Speaking in full sentences.  Sats upper 90s to 100 on 4 L Abdominal:     General: There is no distension.     Palpations: Abdomen is soft. There is no mass.     Tenderness: There is no abdominal tenderness. There is no guarding or rebound.  Musculoskeletal:        General: Normal range of motion.     Cervical back: Normal range of motion and neck supple.     Right lower leg: No edema.     Left lower leg: No edema.  Skin:    General: Skin is warm and dry.     Capillary Refill: Capillary refill takes less than 2 seconds.  Neurological:     Mental Status: He is alert and oriented to person, place, and time.     ED Results / Procedures / Treatments   Labs (all labs ordered are listed, but only abnormal results are displayed) Labs Reviewed  BASIC METABOLIC PANEL - Abnormal; Notable for the following components:      Result Value   CO2 18 (*)    Glucose, Bld 153 (*)    BUN 51 (*)    Creatinine, Ser 1.80 (*)    Calcium 8.5 (*)    GFR calc non Af Amer 35 (*)    GFR calc Af Amer 41 (*)    All other components within normal limits  CBC - Abnormal; Notable for the following components:   WBC 13.1 (*)    All other components within normal limits  POC SARS CORONAVIRUS 2 AG -  ED - Abnormal; Notable for the following components:   SARS Coronavirus 2 Ag POSITIVE (*)    All other components within normal limits  TROPONIN I (HIGH SENSITIVITY)  TROPONIN I (HIGH SENSITIVITY)     EKG None  Radiology DG Chest 2 View  Result Date: 10/30/2019 CLINICAL DATA:  Shortness of breath EXAM: CHEST - 2 VIEW COMPARISON:  April 02, 2018 FINDINGS: Emphysematous changes and lung hyperinflation is again noted. There are coarse lung markings at the lung bases bilaterally favored to represent atelectasis or scarring. There is no large focal  infiltrate. There is no pneumothorax. The heart size is similar to prior study. There is no acute osseous abnormality. IMPRESSION: COPD without evidence for an acute cardiopulmonary process. Electronically Signed   By: Katherine Mantle M.D.   On: 10/30/2019 16:45    Procedures Procedures (including critical care time)  Medications Ordered in ED Medications  albuterol (VENTOLIN HFA) 108 (90 Base) MCG/ACT inhaler 8 puff (8 puffs Inhalation Given 10/30/19 1907)  magnesium sulfate IVPB 2 g 50 mL (2 g Intravenous New Bag/Given 10/30/19 1909)  ipratropium (ATROVENT HFA) inhaler 2 puff (2 puffs Inhalation Given 10/30/19 1907)    ED Course  I have reviewed the triage vital signs and the nursing notes.  Pertinent labs & imaging results that were available during my care of the patient were reviewed by me and considered in my medical decision making (see chart for details).    MDM Rules/Calculators/A&P                      Patient presenting for evaluation of shortness of breath.  Physical exam shows patient appears nontoxic.  However, he is on oxygen when this is not his baseline.  Diminished lung sounds throughout with scattered wheezing.  Patient reports symptoms improved with the treatments given with EMS.  Likely COPD exacerbation.  As he already received steroids, will give mag and further breathing treatments.  Will obtain Covid test, x-ray, and labs.   Labs show mild leukocytosis at 13.  Creatinine not far from baseline at 1.8.  Probably slightly dehydrated.  Rapid Covid is positive, likely causing the COPD exacerbation.  Chest x-ray reviewed by  me, no pneumonia pneumothorax or effusion.  Per radiologist, consistent with COPD.  On reassessment after breathing treatments and mag, patient reports breathing is improved.  Wheezing is resolved, although lungs remain diminished.  Off oxygen, patient remains around 93%, which is his baseline per chart review.  With ambulation, patient dropped to 89% and promptly returned to low 90s with rest.  In the setting of COPD, this is likely his baseline.  Patient reports no distress with ambulation.  Discussed findings with patient and son.  Discussed Covid likely triggering COPD exacerbation.  Discussed continued symptomatic treatment at home and close monitoring of respiratory status.  Discussed that at this time, I do not believe he needs admission for Covid, as his sats are reassuring in the setting of COPD.  Discussed prompt return to the ER with any worsening shortness of breath.  At this time, patient appears safe for discharge.  Return precautions given.  Patient and son state they understand and agree to plan.  Neil Smith was evaluated in Emergency Department on 10/30/2019 for the symptoms described in the history of present illness. He was evaluated in the context of the global COVID-19 pandemic, which necessitated consideration that the patient might be at risk for infection with the SARS-CoV-2 virus that causes COVID-19. Institutional protocols and algorithms that pertain to the evaluation of patients at risk for COVID-19 are in a state of rapid change based on information released by regulatory bodies including the CDC and federal and state organizations. These policies and algorithms were followed during the patient's care in the ED.   Final Clinical Impression(s) / ED Diagnoses Final diagnoses:  COVID-19  COPD exacerbation (HCC)    Rx / DC Orders ED Discharge Orders         Ordered    benzonatate (TESSALON) 100 MG capsule  3 times daily PRN  10/30/19 2053            Franchot Heidelberg, PA-C 10/30/19 2116    Fredia Sorrow, MD 11/18/19 1606

## 2019-10-30 NOTE — ED Notes (Signed)
Pt able to tolerate 3L [place] at this time, spO297%

## 2019-10-30 NOTE — ED Notes (Signed)
Patient verbalizes understanding of discharge instructions. Opportunity for questioning and answers were provided. Armband removed by staff, pt discharged from ED to home via POV  

## 2019-10-30 NOTE — ED Triage Notes (Signed)
Pt to triage via [facility] from home.  C/o SOB since yesterday with nasal congestion.  Pt staying with his daughter and didn't have his meds.  88% on Room air.  96% on 4liters.   2 Duo nebs and Solumedrol 125 mg with EMS.Neil Smith

## 2019-10-31 ENCOUNTER — Telehealth: Payer: Self-pay | Admitting: Nurse Practitioner

## 2019-10-31 NOTE — Telephone Encounter (Signed)
Called and spoke with patient's son to Discuss with patient about Covid symptoms and the use of bamlanivimab, a monoclonal antibody infusion for those with mild to moderate Covid symptoms and at a high risk of hospitalization.     Pt is qualified for this infusion at the [facility] infusion center due to co-morbid conditions and/or a member of an at-risk group.     Patient Active Problem List   Diagnosis Date Noted  . Peripheral edema 04/30/2018  . BMI 34.0-34.9,adult 05/09/2015  . Hyperlipidemia with target LDL less than 100 07/18/2014  . Neuropathy 07/18/2014  . COPD (chronic obstructive pulmonary disease) (HCC) 01/09/2014  . Hypertension 01/09/2014    Patient's son stated that he would discuss infusion with his father and call back if he decides that he does want infusion. Symptoms tier reviewed as well as criteria for ending isolation. Preventative practices reviewed. Patient verbalized understanding.    Patient advised to call back if he decides that he does want to get infusion. Callback number to the infusion center given. Patient advised to go to Urgent care or ED with severe symptoms.

## 2019-11-01 ENCOUNTER — Telehealth: Payer: Self-pay | Admitting: Nurse Practitioner

## 2019-11-01 NOTE — Telephone Encounter (Signed)
Neil Smith called and aware of recommendations  Oxygen is dropping some - she is concerned = virtual visit made for tomorrow for face to face

## 2019-11-01 NOTE — Telephone Encounter (Signed)
What is the name of the medication? predniSONE (DELTASONE) 20 MG tablet  Have you contacted your pharmacy to request a refill? No refills--prednisone has been working and wants a refill. Pt pos for COVID. Pt was doing good yesterday and but today he is not able to get out of bed.  Which pharmacy would you like this sent to? [facility]   Patient notified that their request is being sent to the clinical staff for review and that they should receive a call once it is complete. If they do not receive a call within 24 hours they can check with their pharmacy or our office.

## 2019-11-01 NOTE — Telephone Encounter (Signed)
Should not need another rx for prednisone. Styas in system for seeral day safter stops taking. If he is better, should not need anymore.

## 2019-11-02 ENCOUNTER — Telehealth: Payer: Medicare HMO

## 2019-11-02 ENCOUNTER — Telehealth (INDEPENDENT_AMBULATORY_CARE_PROVIDER_SITE_OTHER): Payer: Medicare HMO | Admitting: Family Medicine

## 2019-11-02 ENCOUNTER — Encounter: Payer: Self-pay | Admitting: Family Medicine

## 2019-11-02 ENCOUNTER — Other Ambulatory Visit: Payer: Self-pay | Admitting: Nurse Practitioner

## 2019-11-02 VITALS — Resp 32

## 2019-11-02 DIAGNOSIS — J449 Chronic obstructive pulmonary disease, unspecified: Secondary | ICD-10-CM

## 2019-11-02 DIAGNOSIS — R0902 Hypoxemia: Secondary | ICD-10-CM | POA: Diagnosis not present

## 2019-11-02 DIAGNOSIS — U071 COVID-19: Secondary | ICD-10-CM | POA: Diagnosis not present

## 2019-11-02 DIAGNOSIS — R0602 Shortness of breath: Secondary | ICD-10-CM | POA: Diagnosis not present

## 2019-11-02 NOTE — Progress Notes (Signed)
Virtual Visit via telephone Note  I connected with Neil Smith on 11/02/19 at 5:10 PM by video and verified that I am speaking with the correct person using two identifiers. Neil Smith is currently located at home and his two sons are currently with him during visit, which he is okay with. The provider, Neil Brooklyn, FNP is located in their office at time of visit.  I discussed the limitations, risks, security and privacy concerns of performing an evaluation and management service by telephone and the availability of in person appointments. I also discussed with the patient that there may be a patient responsible charge related to this service. The patient expressed understanding and agreed to proceed.  Subjective: PCP: Neil Pretty, FNP  Chief Complaint  Patient presents with  . Shortness of Breath   Patient has been more short of breath for over a week now.  He was seen at [facility] on 10/30/2019 where he was diagnosed with COVID-19.  While in the ER he was on 4 L of oxygen.  He was weaned off and remained around 93%.  He did drop to 89% with ambulation but promptly returned to low 90s with rest.  Patient was discharged with a prescription of Tessalon Perles 100 mg 3 times daily as needed.  The patient son reports today that his father was very lethargic and weak when he was seen in the emergency room and that he has improved in that respect.  They have been checking his oxygen at home with a pulse oximeter and patient is averaging 86% at rest and drops to 77 to 78% when he gets up moving around.  Patient does have COPD and has been using his inhalers.  He is using the albuterol every 4 hours. Family is hoping oxygen can be ordered.    ROS: Per HPI  Current Outpatient Medications:  .  albuterol (PROVENTIL HFA;VENTOLIN HFA) 108 (90 Base) MCG/ACT inhaler, TAKE 2 PUFFS BY MOUTH EVERY 6 HOURS AS NEEDED FOR WHEEZE OR SHORTNESS OF BREATH, Disp: 18 Inhaler, Rfl:  0 .  atorvastatin (LIPITOR) 20 MG tablet, Take 1 tablet (20 mg total) by mouth every evening., Disp: 90 tablet, Rfl: 1 .  benzonatate (TESSALON) 100 MG capsule, Take 1 capsule (100 mg total) by mouth 3 (three) times daily as needed for cough., Disp: 21 capsule, Rfl: 0 .  Fluticasone-Salmeterol (ADVAIR DISKUS) 250-50 MCG/DOSE AEPB, TAKE 1 PUFF BY MOUTH TWICE A DAY, Disp: 180 each, Rfl: 1 .  furosemide (LASIX) 20 MG tablet, Take 1 tablet (20 mg total) by mouth daily., Disp: 90 tablet, Rfl: 1 .  gabapentin (NEURONTIN) 300 MG capsule, Take 1 capsule (300 mg total) by mouth at bedtime., Disp: 90 capsule, Rfl: 1 .  lisinopril (ZESTRIL) 40 MG tablet, Take 1 tablet (40 mg total) by mouth daily., Disp: 90 tablet, Rfl: 1 .  loratadine (CLARITIN) 10 MG tablet, Take 10 mg by mouth daily., Disp: , Rfl:  .  magnesium oxide (MAG-OX) 400 MG tablet, Take 1 tablet (400 mg total) by mouth daily., Disp: 90 tablet, Rfl: 1  No Known Allergies Past Medical History:  Diagnosis Date  . COPD (chronic obstructive pulmonary disease) ([place])   . Hyperlipidemia   . Hypertension     Observations/Objective: Today's Vitals   11/02/19 1734  Resp: (!) 32  SpO2: (!) 86%   SpO2 77-78% with activity.  A&O  No wheezing audible. Patient does appear to be working to breath.  Mood, judgement,  and thought processes all WNL  Assessment and Plan: 1-4. COVID-19/Hypoxemia/Shortness of breath/Chronic obstructive pulmonary disease, unspecified COPD type (HCC) - Discussed that I can prescribe oxygen but that I am not sure when it will arrive at the patient's house since it is already after 5 PM.  Discussed that comparing his vital signs to those when he was in the ER a few days ago he appears to be worsening.  I pointed out that his respirations have increased from 22 to 32 and his oxygen saturation at rest has dropped from 93% to 86% and his oxygen saturation with activity has dropped from 89% down to 77%.  Patient and his sons do not  seem to wish to take him back to the emergency room.  I did advise if he gets any worse that they should take him.  Discussed taking vitamin C and zinc to help boost immune system.  He will continue to use his albuterol inhaler for shortness of breath.  Oxygen ordered via nasal cannula at 4 L/min. - For home use only DME oxygen   Follow Up Instructions:   I discussed the assessment and treatment plan with the patient. The patient was provided an opportunity to ask questions and all were answered. The patient agreed with the plan and demonstrated an understanding of the instructions.   The patient was advised to call back or seek an in-person evaluation if the symptoms worsen or if the condition fails to improve as anticipated.  The above assessment and management plan was discussed with the patient. The patient verbalized understanding of and has agreed to the management plan. Patient is aware to call the clinic if symptoms persist or worsen. Patient is aware when to return to the clinic for a follow-up visit. Patient educated on when it is appropriate to go to the emergency department.   Time call ended: 5:27 PM  I provided 23 minutes of face-to-face time during this encounter.   Deliah Boston, MSN, APRN, FNP-C [facility] 11/02/19

## 2019-11-03 DIAGNOSIS — J449 Chronic obstructive pulmonary disease, unspecified: Secondary | ICD-10-CM | POA: Diagnosis not present

## 2019-11-04 ENCOUNTER — Ambulatory Visit (INDEPENDENT_AMBULATORY_CARE_PROVIDER_SITE_OTHER): Payer: Medicare HMO | Admitting: Nurse Practitioner

## 2019-11-04 ENCOUNTER — Encounter: Payer: Self-pay | Admitting: Nurse Practitioner

## 2019-11-04 DIAGNOSIS — Z6834 Body mass index (BMI) 34.0-34.9, adult: Secondary | ICD-10-CM

## 2019-11-04 DIAGNOSIS — G629 Polyneuropathy, unspecified: Secondary | ICD-10-CM

## 2019-11-04 DIAGNOSIS — J410 Simple chronic bronchitis: Secondary | ICD-10-CM | POA: Diagnosis not present

## 2019-11-04 DIAGNOSIS — I10 Essential (primary) hypertension: Secondary | ICD-10-CM | POA: Diagnosis not present

## 2019-11-04 DIAGNOSIS — E785 Hyperlipidemia, unspecified: Secondary | ICD-10-CM | POA: Diagnosis not present

## 2019-11-04 DIAGNOSIS — R609 Edema, unspecified: Secondary | ICD-10-CM | POA: Diagnosis not present

## 2019-11-04 MED ORDER — ATORVASTATIN CALCIUM 20 MG PO TABS: 20.0000 mg | ORAL_TABLET | Freq: Every evening | ORAL | 1 refills | Status: DC

## 2019-11-04 MED ORDER — FUROSEMIDE 20 MG PO TABS: 20.0000 mg | ORAL_TABLET | Freq: Every day | ORAL | 1 refills | Status: DC

## 2019-11-04 MED ORDER — LISINOPRIL 40 MG PO TABS: 40.0000 mg | ORAL_TABLET | Freq: Every day | ORAL | 1 refills | Status: DC

## 2019-11-04 MED ORDER — GABAPENTIN 300 MG PO CAPS: 300.0000 mg | ORAL_CAPSULE | Freq: Every day | ORAL | 1 refills | Status: DC

## 2019-11-04 NOTE — Progress Notes (Signed)
Virtual Visit via telephone Note Due to COVID-19 pandemic this visit was conducted virtually. This visit type was conducted due to national recommendations for restrictions regarding the COVID-19 Pandemic (e.g. social distancing, sheltering in place) in an effort to limit this patient's exposure and mitigate transmission in our community. All issues noted in this document were discussed and addressed.  A physical exam was not performed with this format.  I connected with Neil Smith on 11/04/19 at 11:25 by telephone and verified that I am speaking with the correct person using two identifiers. Neil Smith is currently located at his sons home and his son is currently with him during visit. The provider, Mary-Margaret Daphine Deutscher, FNP is located in their office at time of visit.  I discussed the limitations, risks, security and privacy concerns of performing an evaluation and management service by telephone and the availability of in person appointments. I also discussed with the patient that there may be a patient responsible charge related to this service. The patient expressed understanding and agreed to proceed.   History and Present Illness:  Patient ID: Neil Smith, male    DOB: 1941/09/25, 79 y.o.   MRN: [id number]   Chief Complaint: Medical Management of Chronic Issues    HPI:  1. Essential hypertension No c/o chest pain, sob or headache. Does not check blood pressure at home. BP Readings from Last 3 Encounters:  10/30/19 125/74  04/30/18 113/61  10/14/17 (!) 145/76     2. Hyperlipidemia with target LDL less than 100 Eats whatever his family fixes him. Doe snot do much exercise. Lab Results  Component Value Date   CHOL 120 04/30/2018   HDL 30 (L) 04/30/2018   LDLCALC 52 04/30/2018   TRIG 190 (H) 04/30/2018   CHOLHDL 4.0 04/30/2018     3. Simple chronic bronchitis (HCC) Wears Oxygen daily. He says he is doing well.  4. Neuropathy Has constant burning in  bil feet. Is on gabapentin and is doing well.  5. Peripheral edema Has daily edema. Elevation works well for him  6. BMI 34.0-34.9,adult No recent weight cjanges. Wt Readings from Last 3 Encounters:  04/30/18 254 lb (115.2 kg)  10/14/17 252 lb (114.3 kg)  04/02/17 249 lb (112.9 kg)   BMI Readings from Last 3 Encounters:  04/30/18 36.45 kg/m  10/14/17 36.16 kg/m  04/02/17 33.77 kg/m       Outpatient Encounter Medications as of 11/04/2019  Medication Sig  . albuterol (VENTOLIN HFA) 108 (90 Base) MCG/ACT inhaler TAKE 2 PUFFS BY MOUTH EVERY 6 HOURS AS NEEDED FOR WHEEZE OR SHORTNESS OF BREATH  . atorvastatin (LIPITOR) 20 MG tablet Take 1 tablet (20 mg total) by mouth every evening.  . benzonatate (TESSALON) 100 MG capsule Take 1 capsule (100 mg total) by mouth 3 (three) times daily as needed for cough.  . Fluticasone-Salmeterol (ADVAIR DISKUS) 250-50 MCG/DOSE AEPB TAKE 1 PUFF BY MOUTH TWICE A DAY  . furosemide (LASIX) 20 MG tablet Take 1 tablet (20 mg total) by mouth daily.  Marland Kitchen gabapentin (NEURONTIN) 300 MG capsule Take 1 capsule (300 mg total) by mouth at bedtime.  Marland Kitchen lisinopril (ZESTRIL) 40 MG tablet Take 1 tablet (40 mg total) by mouth daily.  Marland Kitchen loratadine (CLARITIN) 10 MG tablet Take 10 mg by mouth daily.  . magnesium oxide (MAG-OX) 400 MG tablet Take 1 tablet (400 mg total) by mouth daily.     History reviewed. No pertinent surgical history.  Family History  Problem Relation Age  of Onset  . Dementia Mother 57  . Heart attack Brother   . Hyperlipidemia Son   . Heart attack Brother   . Alcohol abuse Brother   . Cancer Brother 48       Lung  . Hyperlipidemia Son     New complaints: Tested positive for covid last week. Family says he is doing well.is on oxygen and 02 sat are running from 88-92 on 4l.  Social history: Currently living with his son.  Controlled substance contract: n/a    Review of Systems  Constitutional: Negative for diaphoresis, fatigue and  fever.  Eyes: Negative for pain.  Respiratory: Positive for cough (slight). Negative for shortness of breath.   Cardiovascular: Negative for chest pain, palpitations and leg swelling.  Gastrointestinal: Negative for abdominal pain.  Endocrine: Negative for polydipsia.  Musculoskeletal: Positive for myalgias.  Skin: Negative for rash.  Neurological: Negative for dizziness, weakness and headaches.  Hematological: Does not bruise/bleed easily.  All other systems reviewed and are negative.       Observations/Objective: Alert and oriented- answers all questions appropriately No distress B/p 111/63 pulse ox 90% on 4l of o2 Slight sob noted when talking  Assessment and Plan: Neil Smith comes in today with chief complaint of Medical Management of Chronic Issues   Diagnosis and orders addressed:  1. Essential hypertension Low sodium diet - lisinopril (ZESTRIL) 40 MG tablet; Take 1 tablet (40 mg total) by mouth daily.  Dispense: 90 tablet; Refill: 1  2. Hyperlipidemia with target LDL less than 100 Low fat diet - atorvastatin (LIPITOR) 20 MG tablet; Take 1 tablet (20 mg total) by mouth every evening.  Dispense: 90 tablet; Refill: 1  3. Simple chronic bronchitis (HCC) Continue o2 at home Keep check pf pulse ox  4. Neuropathy Do not go barefooted Have someone check feet daily - gabapentin (NEURONTIN) 300 MG capsule; Take 1 capsule (300 mg total) by mouth at bedtime.  Dispense: 90 capsule; Refill: 1  5. Peripheral edema Elevate legs when sitting - furosemide (LASIX) 20 MG tablet; Take 1 tablet (20 mg total) by mouth daily.  Dispense: 90 tablet; Refill: 1  6. BMI 34.0-34.9,adult Discussed diet and exercise for person with BMI >25 Will recheck weight in 3-6 months    Previous labs reviewed Health Maintenance reviewed Diet and exercise encouraged  Follow up plan: 3 months      I discussed the assessment and treatment plan with the patient. The patient was  provided an opportunity to ask questions and all were answered. The patient agreed with the plan and demonstrated an understanding of the instructions.   The patient was advised to call back or seek an in-person evaluation if the symptoms worsen or if the condition fails to improve as anticipated.  The above assessment and management plan was discussed with the patient. The patient verbalized understanding of and has agreed to the management plan. Patient is aware to call the clinic if symptoms persist or worsen. Patient is aware when to return to the clinic for a follow-up visit. Patient educated on when it is appropriate to go to the emergency department.   Time call ended:  11:45  I provided 20 minutes of non-face-to-face time during this encounter.    Mary-Margaret Hassell Done, FNP

## 2019-11-09 ENCOUNTER — Encounter: Payer: Self-pay | Admitting: Family Medicine

## 2019-11-09 DIAGNOSIS — R0902 Hypoxemia: Secondary | ICD-10-CM

## 2019-11-09 DIAGNOSIS — R0602 Shortness of breath: Secondary | ICD-10-CM

## 2019-11-09 MED ORDER — OPTICHAMBER FACE MASK-LARGE MISC: 1.0000 | 0 refills | Status: DC | PRN

## 2019-11-14 ENCOUNTER — Other Ambulatory Visit: Payer: Self-pay | Admitting: Nurse Practitioner

## 2019-11-14 DIAGNOSIS — R609 Edema, unspecified: Secondary | ICD-10-CM

## 2019-11-22 ENCOUNTER — Encounter: Payer: Self-pay | Admitting: Family Medicine

## 2019-11-27 ENCOUNTER — Other Ambulatory Visit: Payer: Self-pay | Admitting: Nurse Practitioner

## 2019-12-04 DIAGNOSIS — J449 Chronic obstructive pulmonary disease, unspecified: Secondary | ICD-10-CM | POA: Diagnosis not present

## 2019-12-26 ENCOUNTER — Encounter: Payer: Self-pay | Admitting: Family Medicine

## 2019-12-27 ENCOUNTER — Other Ambulatory Visit: Payer: Self-pay | Admitting: Nurse Practitioner

## 2020-01-01 DIAGNOSIS — J449 Chronic obstructive pulmonary disease, unspecified: Secondary | ICD-10-CM | POA: Diagnosis not present

## 2020-01-17 ENCOUNTER — Telehealth (INDEPENDENT_AMBULATORY_CARE_PROVIDER_SITE_OTHER): Payer: Medicare HMO | Admitting: Nurse Practitioner

## 2020-01-17 DIAGNOSIS — J410 Simple chronic bronchitis: Secondary | ICD-10-CM

## 2020-01-17 DIAGNOSIS — R0902 Hypoxemia: Secondary | ICD-10-CM

## 2020-01-17 NOTE — Telephone Encounter (Signed)
I placed an order for portable oxygen.

## 2020-01-17 NOTE — Telephone Encounter (Signed)
Aware I am in contact with [facility] about oxygen

## 2020-01-19 NOTE — Telephone Encounter (Signed)
[facility] will contact patient

## 2020-02-01 ENCOUNTER — Ambulatory Visit (INDEPENDENT_AMBULATORY_CARE_PROVIDER_SITE_OTHER): Payer: Medicare HMO

## 2020-02-01 DIAGNOSIS — Z0000 Encounter for general adult medical examination without abnormal findings: Secondary | ICD-10-CM

## 2020-02-01 DIAGNOSIS — J449 Chronic obstructive pulmonary disease, unspecified: Secondary | ICD-10-CM | POA: Diagnosis not present

## 2020-02-01 NOTE — Progress Notes (Signed)
MEDICARE ANNUAL WELLNESS VISIT  02/01/2020  Telephone Visit Disclaimer This Medicare AWV was conducted by telephone due to national recommendations for restrictions regarding the COVID-19 Pandemic (e.g. social distancing).  I verified, using two identifiers, that I am speaking with Neil Smith or their authorized healthcare agent. I discussed the limitations, risks, security, and privacy concerns of performing an evaluation and management service by telephone and the potential availability of an in-person appointment in the future. The patient expressed understanding and agreed to proceed.   Subjective:  Neil Smith is a 79 y.o. male patient of Neil Pierini, FNP who had a Medicare Annual Wellness Visit today via telephone. Trust resides in [place] and is a widow. He was diagnosed with Covid a few months back and since then he has been staying with his oldest son and his wife. He is retired and worked for years for [employer] and then [employer] when the company was bought out. He worked in [employer] there. He enjoys mowing his yard. He now requires continuous oxygen since his Covid diagnosis and is requesting a small portable O2 machine for easy transport. He has two children.   Patient Care Team: Neil Pierini, FNP as PCP - General (Nurse Practitioner)  Advanced Directives 02/01/2020 05/09/2015  Does Patient Have a Medical Advance Directive? No Yes;No  Does patient want to make changes to medical advance directive? - Yes - information given  Would patient like information on creating a medical advance directive? No - Patient declined [facility] Utilization Over the Past 12 Months: # of hospitalizations or ER visits: 1 # of surgeries: 0  Review of Systems    Patient reports that his overall health is worse compared to last year.  History obtained from chart review  Patient Reported Readings (BP, Pulse, CBG, Weight, etc) none  Pain Assessment Pain :  No/denies pain     Current Medications & Allergies (verified) Allergies as of 02/01/2020   No Known Allergies     Medication List       Accurate as of February 01, 2020  1:48 PM. If you have any questions, ask your nurse or doctor.        albuterol 108 (90 Base) MCG/ACT inhaler Commonly known as: VENTOLIN HFA TAKE 2 PUFFS BY MOUTH EVERY 6 HOURS AS NEEDED FOR WHEEZE OR SHORTNESS OF BREATH   atorvastatin 20 MG tablet Commonly known as: LIPITOR Take 1 tablet (20 mg total) by mouth every evening.   benzonatate 100 MG capsule Commonly known as: TESSALON Take 1 capsule (100 mg total) by mouth 3 (three) times daily as needed for cough.   Fluticasone-Salmeterol 250-50 MCG/DOSE Aepb Commonly known as: Advair Diskus TAKE 1 PUFF BY MOUTH TWICE A DAY   furosemide 20 MG tablet Commonly known as: LASIX Take 1 tablet (20 mg total) by mouth daily.   gabapentin 300 MG capsule Commonly known as: NEURONTIN Take 1 capsule (300 mg total) by mouth at bedtime.   lisinopril 40 MG tablet Commonly known as: ZESTRIL Take 1 tablet (40 mg total) by mouth daily.   loratadine 10 MG tablet Commonly known as: CLARITIN Take 10 mg by mouth daily.   magnesium oxide 400 MG tablet Commonly known as: MAG-OX TAKE 1 TABLET BY MOUTH EVERY DAY   magnesium oxide 400 MG tablet Commonly known as: MAG-OX TAKE 1 TABLET BY MOUTH EVERY DAY   OptiChamber Face Mask-Large Misc 1 applicator by Does not apply route as needed.  History (reviewed): Past Medical History:  Diagnosis Date  . COPD (chronic obstructive pulmonary disease) ([place])   . Hyperlipidemia   . Hypertension    History reviewed. No pertinent surgical history. Family History  Problem Relation Age of Onset  . Dementia Mother 57  . Heart attack Brother   . Hyperlipidemia Son   . Heart attack Brother   . Alcohol abuse Brother   . Cancer Brother 48       Lung  . Hyperlipidemia Son    Social History   Socioeconomic History  .  Marital status: Widowed    Spouse name: Not on file  . Number of children: 2  . Years of education: 7  . Highest education level: 8th grade  Occupational History  . Occupation: Retired    Comment: Charity fundraiser  Tobacco Use  . Smoking status: Former Smoker    Types: Cigarettes    Quit date: 10/14/2002    Years since quitting: 17.3  . Smokeless tobacco: Never Used  Substance and Sexual Activity  . Alcohol use: No  . Drug use: No  . Sexual activity: Not Currently  Other Topics Concern  . Not on file  Social History Narrative  . Not on file   Social Determinants of Health   Financial Resource Strain:   . Difficulty of Paying Living Expenses:   Food Insecurity:   . Worried About Charity fundraiser in the Last Year:   . Arboriculturist in the Last Year:   Transportation Needs:   . Film/video editor (Medical):   Marland Kitchen Lack of Transportation (Non-Medical):   Physical Activity:   . Days of Exercise per Week:   . Minutes of Exercise per Session:   Stress:   . Feeling of Stress :   Social Connections:   . Frequency of Communication with Friends and Family:   . Frequency of Social Gatherings with Friends and Family:   . Attends Religious Services:   . Active Member of Clubs or Organizations:   . Attends Archivist Meetings:   Marland Kitchen Marital Status:     Activities of Daily Living In your present state of health, do you have any difficulty performing the following activities: 02/01/2020  Hearing? N  Vision? Y  Comment Wears reading glasses  Difficulty concentrating or making decisions? N  Walking or climbing stairs? Y  Comment Slightly wobbly and has to have O2 to prevent SOB  Dressing or bathing? N  Doing errands, shopping? N  Preparing Food and eating ? N  Using the Toilet? N  In the past six months, have you accidently leaked urine? N  Do you have problems with loss of bowel control? N  Managing your Medications? N  Managing your Finances? N  Housekeeping or  managing your Housekeeping? N  Some recent data might be hidden    Patient Education/ Literacy How often do you need to have someone help you when you read instructions, pamphlets, or other written materials from your doctor or pharmacy?: 1 - Never What is the last grade level you completed in school?: 8th grade  Exercise Current Exercise Habits: The patient does not participate in regular exercise at present, Exercise limited by: respiratory conditions(s)  Diet Patient reports consuming 3 meals a day and 2 snack(s) a day Patient reports that his primary diet is: Regular Patient reports that she does have regular access to food.   Depression Screen PHQ 2/9 Scores 02/01/2020 07/18/2019 04/07/2019 10/14/2017 04/02/2017 06/02/2016 05/09/2015  PHQ - 2 Score 0 0 0 0 0 0 0     Fall Risk Fall Risk  02/01/2020 07/18/2019 04/02/2017 06/02/2016 05/09/2015  Falls in the past year? 0 0 No No No     Objective:  Neil Smith seemed alert and oriented and he participated appropriately during our telephone visit.  Blood Pressure Weight BMI  BP Readings from Last 3 Encounters:  10/30/19 125/74  04/30/18 113/61  10/14/17 (!) 145/76   Wt Readings from Last 3 Encounters:  04/30/18 254 lb (115.2 kg)  10/14/17 252 lb (114.3 kg)  04/02/17 249 lb (112.9 kg)   BMI Readings from Last 1 Encounters:  04/30/18 36.45 kg/m    *Unable to obtain current vital signs, weight, and BMI due to telephone visit type  Hearing/Vision  . Ewart did not seem to have difficulty with hearing/understanding during the telephone conversation . Reports that he has not had a formal eye exam by an eye care professional within the past year . Reports that he has not had a formal hearing evaluation within the past year *Unable to fully assess hearing and vision during telephone visit type  Cognitive Function: 6CIT Screen 02/01/2020  What Year? 4 points  What month? 0 points  What time? 0 points  Count back from 20 0 points    Months in reverse 0 points  Repeat phrase 0 points  Total Score 4   (Normal:0-7, Significant for Dysfunction: >8)  Normal Cognitive Function Screening: No: Patient was unable to complete well and also struggled with what year it was.    Immunization & Health Maintenance Record Immunization History  Administered Date(s) Administered  . Fluad Quad(high Dose 65+) 07/25/2019  . Influenza, High Dose Seasonal PF 07/03/2014, 07/28/2016, 08/25/2017, 09/29/2018  . Influenza,inj,Quad PF,6+ Mos 08/15/2013, 07/30/2015  . Pneumococcal Conjugate-13 05/09/2015  . Pneumococcal Polysaccharide-23 10/14/2017    Health Maintenance  Topic Date Due  . TETANUS/TDAP  04/06/2020 (Originally 02/14/1960)  . INFLUENZA VACCINE  05/27/2020  . PNA vac Low Risk Adult  Completed       Assessment  This is a routine wellness examination for [place].  Health Maintenance: Due or Overdue There are no preventive care reminders to display for this patient.  Neil Smith does not need a referral for Community Assistance: Care Management:   no Social Work:    no Prescription Assistance:  no Nutrition/Diabetes Education:  no   Plan:  Personalized Goals Goals Addressed            This Visit's Progress   . Exercise 3x per week (30 min per time)      . Have 3 meals a day        Personalized Health Maintenance & Screening Recommendations  Advanced directive infomation  Lung Cancer Screening Recommended: no (Low Dose CT Chest recommended if Age 2-80 years, 30 pack-year currently smoking OR have quit w/in past 15 years) Hepatitis C Screening recommended: not applicable HIV Screening recommended: no  Advanced Directives: Written information was not prepared per patient's request.  Referrals & Orders No orders of the defined types were placed in this encounter.   Follow-up Plan . Follow-up with Neil Pierini, FNP as planned . Patient is scheduled Friday for a regular follow  up    I have personally reviewed and noted the following in the patient's chart:   . Medical and social history . Use of alcohol, tobacco or illicit drugs  . Current medications and supplements . Functional ability  and status . Nutritional status . Physical activity . Advanced directives . List of other physicians . Hospitalizations, surgeries, and ER visits in previous 12 months . Vitals . Screenings to include cognitive, depression, and falls . Referrals and appointments  In addition, I have reviewed and discussed with Neil Smith certain preventive protocols, quality metrics, and best practice recommendations. A written personalized care plan for preventive services as well as general preventive health recommendations is available and can be mailed to the patient at his request.      Cleda Daub LPN [id number]

## 2020-02-03 ENCOUNTER — Encounter: Payer: Self-pay | Admitting: Nurse Practitioner

## 2020-02-03 ENCOUNTER — Other Ambulatory Visit: Payer: Self-pay

## 2020-02-03 ENCOUNTER — Ambulatory Visit (INDEPENDENT_AMBULATORY_CARE_PROVIDER_SITE_OTHER): Payer: Medicare HMO | Admitting: Nurse Practitioner

## 2020-02-03 VITALS — BP 146/71 | HR 77 | Temp 99.2°F | Resp 20 | Ht 70.0 in | Wt 236.0 lb

## 2020-02-03 DIAGNOSIS — J410 Simple chronic bronchitis: Secondary | ICD-10-CM

## 2020-02-03 DIAGNOSIS — Z6834 Body mass index (BMI) 34.0-34.9, adult: Secondary | ICD-10-CM | POA: Diagnosis not present

## 2020-02-03 DIAGNOSIS — G629 Polyneuropathy, unspecified: Secondary | ICD-10-CM

## 2020-02-03 DIAGNOSIS — E785 Hyperlipidemia, unspecified: Secondary | ICD-10-CM | POA: Diagnosis not present

## 2020-02-03 DIAGNOSIS — R600 Localized edema: Secondary | ICD-10-CM

## 2020-02-03 DIAGNOSIS — R609 Edema, unspecified: Secondary | ICD-10-CM | POA: Diagnosis not present

## 2020-02-03 DIAGNOSIS — I10 Essential (primary) hypertension: Secondary | ICD-10-CM

## 2020-02-03 MED ORDER — FUROSEMIDE 20 MG PO TABS: 20.0000 mg | ORAL_TABLET | Freq: Every day | ORAL | 1 refills | Status: DC

## 2020-02-03 MED ORDER — GABAPENTIN 300 MG PO CAPS: 300.0000 mg | ORAL_CAPSULE | Freq: Every day | ORAL | 1 refills | Status: DC

## 2020-02-03 MED ORDER — FLUTICASONE-SALMETEROL 250-50 MCG/DOSE IN AEPB: INHALATION_SPRAY | RESPIRATORY_TRACT | 1 refills | Status: DC

## 2020-02-03 MED ORDER — ATORVASTATIN CALCIUM 20 MG PO TABS: 20.0000 mg | ORAL_TABLET | Freq: Every evening | ORAL | 1 refills | Status: DC

## 2020-02-03 MED ORDER — LISINOPRIL 40 MG PO TABS: 40.0000 mg | ORAL_TABLET | Freq: Every day | ORAL | 1 refills | Status: DC

## 2020-02-03 NOTE — Progress Notes (Signed)
Subjective:    Patient ID: Neil Smith, male    DOB: [birth year], 79 y.o.   MRN: [id number]   Chief Complaint: medical management of chronic issues   HPI:  1. Essential hypertension No c/ochest pain, sob or headache. Does not check blood  pressure at home. BP Readings from Last 3 Encounters:  10/30/19 125/74  04/30/18 113/61  10/14/17 (!) 145/76     2. Hyperlipidemia with target LDL less than 100 Does not watch diet and does very little exercise due to his breathing Lab Results  Component Value Date   CHOL 120 04/30/2018   HDL 30 (L) 04/30/2018   LDLCALC 52 04/30/2018   TRIG 190 (H) 04/30/2018   CHOLHDL 4.0 04/30/2018     3. Simple chronic bronchitis ([place]) He has chronic cough. He is on oxygen since his covid dx in January. Is on 3l via nasal canula.  4. Neuropathy H.as constant burning of bil feet. The gabapentin helps but still has burning. Seems to be worse at night  5. Peripheral edema Has edema daily. Has been better sonce he has been on O2. Son says it is much better then it use to be.  6. BMI 34.0-34.9,adult No recent weight changes Wt Readings from Last 3 Encounters:  02/03/20 236 lb (107 kg)  04/30/18 254 lb (115.2 kg)  10/14/17 252 lb (114.3 kg)   BMI Readings from Last 3 Encounters:  02/03/20 33.86 kg/m  04/30/18 36.45 kg/m  10/14/17 36.16 kg/m       Outpatient Encounter Medications as of 02/03/2020  Medication Sig  . albuterol (VENTOLIN HFA) 108 (90 Base) MCG/ACT inhaler TAKE 2 PUFFS BY MOUTH EVERY 6 HOURS AS NEEDED FOR WHEEZE OR SHORTNESS OF BREATH  . atorvastatin (LIPITOR) 20 MG tablet Take 1 tablet (20 mg total) by mouth every evening.  . benzonatate (TESSALON) 100 MG capsule Take 1 capsule (100 mg total) by mouth 3 (three) times daily as needed for cough.  . Fluticasone-Salmeterol (ADVAIR DISKUS) 250-50 MCG/DOSE AEPB TAKE 1 PUFF BY MOUTH TWICE A DAY  . furosemide (LASIX) 20 MG tablet Take 1 tablet (20 mg total) by mouth daily.  Marland Kitchen  gabapentin (NEURONTIN) 300 MG capsule Take 1 capsule (300 mg total) by mouth at bedtime.  Marland Kitchen lisinopril (ZESTRIL) 40 MG tablet Take 1 tablet (40 mg total) by mouth daily.  Marland Kitchen loratadine (CLARITIN) 10 MG tablet Take 10 mg by mouth daily.  . magnesium oxide (MAG-OX) 400 MG tablet TAKE 1 TABLET BY MOUTH EVERY DAY  . magnesium oxide (MAG-OX) 400 MG tablet TAKE 1 TABLET BY MOUTH EVERY DAY  . Spacer/Aero-Holding Chambers (OPTICHAMBER FACE MASK-LARGE) MISC 1 applicator by Does not apply route as needed.   No past surgical history on file.  Family History  Problem Relation Age of Onset  . Dementia Mother 51  . Heart attack Brother   . Hyperlipidemia Son   . Heart attack Brother   . Alcohol abuse Brother   . Cancer Brother 48       Lung  . Hyperlipidemia Son     New complaints: Patient was put on oxygen after being dx with covid several months ago. Gets SOB when does not use O2. He needs a portable tank to take with him when he goes to the doctor. Oxygen levels were taken at home via telephone visit when first ordered and O2 sat was 90% on 4l of oxygen  Social history: Lives with his son right now  Controlled substance contract: n/a  Review of Systems  Constitutional: Negative for diaphoresis.  Eyes: Negative for pain.  Respiratory: Negative for shortness of breath.   Cardiovascular: Negative for chest pain, palpitations and leg swelling.  Gastrointestinal: Negative for abdominal pain.  Endocrine: Negative for polydipsia.  Skin: Negative for rash.  Neurological: Negative for dizziness, weakness and headaches.  Hematological: Does not bruise/bleed easily.  All other systems reviewed and are negative.      Objective:   Physical Exam Vitals and nursing note reviewed.  Constitutional:      Appearance: Normal appearance. He is well-developed.  HENT:     Head: Normocephalic.     Nose: Nose normal.  Eyes:     Pupils: Pupils are equal, round, and reactive to light.  Neck:      Thyroid: No thyroid mass or thyromegaly.     Vascular: No carotid bruit or JVD.     Trachea: Phonation normal.  Cardiovascular:     Rate and Rhythm: Normal rate and regular rhythm.  Pulmonary:     Effort: Pulmonary effort is normal. No respiratory distress.     Comments: diminished breath sounds throughout Abdominal:     General: Bowel sounds are normal.     Palpations: Abdomen is soft.     Tenderness: There is no abdominal tenderness.  Musculoskeletal:        General: Normal range of motion.     Cervical back: Normal range of motion and neck supple.     Right lower leg: Edema (2+ edema) present.     Left lower leg: Edema (2+edema) present.  Lymphadenopathy:     Cervical: No cervical adenopathy.  Skin:    General: Skin is warm and dry.  Neurological:     Mental Status: He is alert and oriented to person, place, and time.  Psychiatric:        Behavior: Behavior normal.        Thought Content: Thought content normal.        Judgment: Judgment normal.    BP (!) 146/71   Pulse 77   Temp 99.2 F (37.3 C) (Temporal)   Resp 20   Ht 5\' 10"  (1.778 m)   Wt 236 lb (107 kg)   SpO2 98% Comment: 3L  BMI 33.86 kg/m   O2 sat on 3L O2 at rest- 99% Exercise without O2 89% Recovery on O2 98%      Assessment & Plan:  CAISON HEARN comes in today with chief complaint of Medical Management of Chronic Issues (Wants rx for portable O2)   Diagnosis and orders addressed:  1. Essential hypertension Low sodium diet - lisinopril (ZESTRIL) 40 MG tablet; Take 1 tablet (40 mg total) by mouth daily.  Dispense: 90 tablet; Refill: 1  2. Hyperlipidemia with target LDL less than 100 Low fat diet - atorvastatin (LIPITOR) 20 MG tablet; Take 1 tablet (20 mg total) by mouth every evening.  Dispense: 90 tablet; Refill: 1  3. Simple chronic bronchitis (HCC) Avoid cigarette smoke Ordered portable O2 - Fluticasone-Salmeterol (ADVAIR DISKUS) 250-50 MCG/DOSE AEPB; TAKE 1 PUFF BY MOUTH TWICE A DAY   Dispense: 180 each; Refill: 1  4. Neuropathy Do not go barefooted - gabapentin (NEURONTIN) 300 MG capsule; Take 1 capsule (300 mg total) by mouth at bedtime.  Dispense: 90 capsule; Refill: 1  5. Peripheral edema Elevate legs when sitting - furosemide (LASIX) 20 MG tablet; Take 1 tablet (20 mg total) by mouth daily.  Dispense: 90 tablet; Refill: 1  6. BMI 34.0-34.9,adult  Discussed diet and exercise for person with BMI >25 Will recheck weight in 3-6 months    Labs pending Health Maintenance reviewed Diet and exercise encouraged  Follow up plan: 3 months   Mary-Margaret Hassell Done, FNP

## 2020-02-03 NOTE — Patient Instructions (Signed)
Home Oxygen Use, Adult When a medical condition keeps you from getting enough oxygen, your health care provider may instruct you to take extra oxygen at home. Your health care provider will let you know:  When to take oxygen.  For how long to take oxygen.  How quickly oxygen should be delivered (flow rate), in liters per minute (LPM or L/M). Home oxygen can be given through:  A mask.  A nasal cannula. This is a device or tube that goes in the nostrils.  A transtracheal catheter. This is a small, flexible tube placed in the trachea.  A tracheostomy. This is a surgically made opening in the trachea. These devices are connected with tubing to an oxygen source, such as:  A tank. Tanks hold oxygen in gas form. They must be replaced when the oxygen is used up.  A liquid oxygen device. This holds oxygen in liquid form. It must be replaced when the oxygen is used up.  An oxygen concentrator machine. This filters oxygen in the room. It uses electricity, so you must have a backup cylinder of oxygen in case the power goes out. Supplies needed: To use oxygen, you will need:  A mask, nasal cannula, transtracheal catheter, or tracheostomy.  An oxygen tank, a liquid oxygen device, or an oxygen concentrator.  The tape that your health care provider recommends (optional). If you use a transtracheal catheter and your prescribed flow rate is 1 LPM or greater, you will also need a humidifier. Risks and complications  Fire. This can happen if the oxygen is exposed to a heat source, flame, or spark.  Injury to skin. This can happen if liquid oxygen touches your skin.  Organ damage. This can happen if you get too little oxygen. How to use oxygen Your health care provider or a representative from your medical device company will show you how to use your oxygen device. Follow her or his instructions. The instructions may look something like this: 1. Wash your hands. 2. If you use an oxygen  concentrator, make sure it is plugged in. 3. Place one end of the tube into the port on the tank, device, or machine. 4. Place the mask over your nose and mouth. Or, place the nasal cannula and secure it with tape if instructed. If you use a tracheostomy or transtracheal catheter, connect it to the oxygen source as directed. 5. Make sure the liter-flow setting on the machine is at the level prescribed by your health care provider. 6. Turn on the machine or adjust the knob on the tank or device to the correct liter-flow setting. 7. When you are done, turn off and unplug the machine, or turn the knob to OFF. How to clean and care for the oxygen supplies Nasal cannula  Clean it with a warm, wet cloth daily or as needed.  Wash it with a liquid soap once a week.  Rinse it thoroughly once or twice a week.  Replace it every 2-4 weeks.  If you have an infection, such as a cold or pneumonia, change the cannula when you get better. Mask  Replace it every 2-4 weeks.  If you have an infection, such as a cold or pneumonia, change the mask when you get better. Humidifier bottle  Wash the bottle between each refill: ? Wash it with soap and warm water. ? Rinse it thoroughly. ? Disinfect it and its top. ? Air-dry it.  Make sure it is dry before you refill it. Oxygen concentrator  Clean   the air filter at least twice a week according to directions from your home medical equipment and service company.  Wipe down the cabinet every day. To do this: ? Unplug the unit. ? Wipe down the cabinet with a damp cloth. ? Dry the cabinet. Other equipment  Change any extra tubing every 1-3 months.  Follow instructions from your health care provider about taking care of any other equipment. Safety tips Fire safety tips   Keep your oxygen and oxygen supplies at least 5 ft away from sources of heat, flames, and sparks at all times.  Do not allow smoking near your oxygen. Put up "no smoking" signs in  your home. Avoid smoking areas when in public.  Do not use materials that can burn (are flammable) while you use oxygen.  When you go to a restaurant with portable oxygen, ask to be seated in the nonsmoking section.  Keep a fire extinguisher close by. Let your fire department know that you have oxygen in your home.  Test your home smoke detectors regularly. Traveling  Secure your oxygen tank in the vehicle so that it does not move around. Follow instructions from your medical device company about how to safely secure your tank.  Make sure you have enough oxygen for the amount of time you will be away from home.  If you are planning air travel, contact the airline to find out if they allow the use of an approved portable oxygen concentrator. You may also need documents from your health care provider and medical device company before you travel. General safety tips  If you use an oxygen cylinder, make sure it is in a stand or secured to an object that will not move (fixed object).  If you use liquid oxygen, make sure its container is kept upright.  If you use an oxygen concentrator: ? Tell your electric company. Make sure you are given priority service in the event that your power goes out. ? Avoid using extension cords, if possible. Follow these instructions at home:  Use oxygen only as told by your health care provider.  Do not use alcohol or other drugs that make you relax (sedating drugs) unless instructed. They can slow down your breathing rate and make it hard to get in enough oxygen.  Know how and when to order a refill of oxygen.  Always keep a spare tank of oxygen. Plan ahead for holidays when you may not be able to get a prescription filled.  Use water-based lubricants on your lips or nostrils. Do not use oil-based products like petroleum jelly.  To prevent skin irritation on your cheeks or behind your ears, tuck some gauze under the tubing. Contact a health care  provider if:  You get headaches often.  You have shortness of breath.  You have a lasting cough.  You have anxiety.  You are sleepy all the time.  You develop an illness that affects your breathing.  You cannot exercise at your regular level.  You are restless.  You have difficult or irregular breathing, and it is getting worse.  You have a fever.  You have persistent redness under your nose. Get help right away if:  You are confused.  You have blue lips or fingernails.  You are struggling to breathe. Summary  Your health care provider or a representative from your medical device company will show you how to use your oxygen device. Follow her or his instructions.  If you use an oxygen concentrator, make   sure it is plugged in.  Make sure the liter-flow setting on the machine is at the level prescribed by your health care provider.  Keep your oxygen and oxygen supplies at least 5 ft away from sources of heat, flames, and sparks at all times. This information is not intended to replace advice given to you by your health care provider. Make sure you discuss any questions you have with your health care provider. Document Revised: 04/01/2018 Document Reviewed: 05/06/2016 Elsevier Patient Education  2020 [employer]vier Inc.  

## 2020-02-04 LAB — CMP14+EGFR
ALT: 14 IU/L (ref 0–44)
AST: 17 IU/L (ref 0–40)
Albumin/Globulin Ratio: 2 (ref 1.2–2.2)
Albumin: 4.3 g/dL (ref 3.7–4.7)
Alkaline Phosphatase: 61 IU/L (ref 39–117)
BUN/Creatinine Ratio: 14 (ref 10–24)
BUN: 16 mg/dL (ref 8–27)
Bilirubin Total: 0.4 mg/dL (ref 0.0–1.2)
CO2: 23 mmol/L (ref 20–29)
Calcium: 9.1 mg/dL (ref 8.6–10.2)
Chloride: 104 mmol/L (ref 96–106)
Creatinine, Ser: 1.17 mg/dL (ref 0.76–1.27)
GFR calc Af Amer: 69 mL/min/{1.73_m2} (ref >59)
GFR calc non Af Amer: 59 mL/min/{1.73_m2} — ABNORMAL LOW (ref >59)
Globulin, Total: 2.2 g/dL (ref 1.5–4.5)
Glucose: 87 mg/dL (ref 65–99)
Potassium: 4.5 mmol/L (ref 3.5–5.2)
Sodium: 141 mmol/L (ref 134–144)
Total Protein: 6.5 g/dL (ref 6.0–8.5)

## 2020-02-04 LAB — CBC WITH DIFFERENTIAL/PLATELET
Basophils Absolute: 0 10*3/uL (ref 0.0–0.2)
Basos: 1 %
EOS (ABSOLUTE): 0.1 10*3/uL (ref 0.0–0.4)
Eos: 2 %
Hematocrit: 38.2 % (ref 37.5–51.0)
Hemoglobin: 12.4 g/dL — ABNORMAL LOW (ref 13.0–17.7)
Immature Grans (Abs): 0 10*3/uL (ref 0.0–0.1)
Immature Granulocytes: 0 %
Lymphocytes Absolute: 1.6 10*3/uL (ref 0.7–3.1)
Lymphs: 26 %
MCH: 30.2 pg (ref 26.6–33.0)
MCHC: 32.5 g/dL (ref 31.5–35.7)
MCV: 93 fL (ref 79–97)
Monocytes Absolute: 0.5 10*3/uL (ref 0.1–0.9)
Monocytes: 7 %
Neutrophils Absolute: 3.9 10*3/uL (ref 1.4–7.0)
Neutrophils: 64 %
Platelets: 263 10*3/uL (ref 150–450)
RBC: 4.1 x10E6/uL — ABNORMAL LOW (ref 4.14–5.80)
RDW: 13.1 % (ref 11.6–15.4)
WBC: 6.2 10*3/uL (ref 3.4–10.8)

## 2020-02-04 LAB — LIPID PANEL
Chol/HDL Ratio: 3.5 ratio (ref 0.0–5.0)
Cholesterol, Total: 110 mg/dL (ref 100–199)
HDL: 31 mg/dL — ABNORMAL LOW (ref >39)
LDL Chol Calc (NIH): 53 mg/dL (ref 0–99)
Triglycerides: 150 mg/dL — ABNORMAL HIGH (ref 0–149)
VLDL Cholesterol Cal: 26 mg/dL (ref 5–40)

## 2020-02-13 ENCOUNTER — Encounter: Payer: Self-pay | Admitting: Nurse Practitioner

## 2020-03-02 DIAGNOSIS — J449 Chronic obstructive pulmonary disease, unspecified: Secondary | ICD-10-CM | POA: Diagnosis not present

## 2020-04-02 DIAGNOSIS — J449 Chronic obstructive pulmonary disease, unspecified: Secondary | ICD-10-CM | POA: Diagnosis not present

## 2020-05-02 DIAGNOSIS — U071 COVID-19: Secondary | ICD-10-CM | POA: Diagnosis not present

## 2020-05-02 DIAGNOSIS — J449 Chronic obstructive pulmonary disease, unspecified: Secondary | ICD-10-CM | POA: Diagnosis not present

## 2020-05-18 ENCOUNTER — Ambulatory Visit: Payer: Self-pay | Admitting: Nurse Practitioner

## 2020-05-31 ENCOUNTER — Other Ambulatory Visit: Payer: Self-pay | Admitting: Nurse Practitioner

## 2020-06-02 DIAGNOSIS — U071 COVID-19: Secondary | ICD-10-CM | POA: Diagnosis not present

## 2020-06-02 DIAGNOSIS — J449 Chronic obstructive pulmonary disease, unspecified: Secondary | ICD-10-CM | POA: Diagnosis not present

## 2020-06-05 ENCOUNTER — Other Ambulatory Visit: Payer: Self-pay | Admitting: Nurse Practitioner

## 2020-06-05 DIAGNOSIS — R609 Edema, unspecified: Secondary | ICD-10-CM

## 2020-06-17 ENCOUNTER — Other Ambulatory Visit: Payer: Self-pay | Admitting: Nurse Practitioner

## 2020-06-17 DIAGNOSIS — R609 Edema, unspecified: Secondary | ICD-10-CM

## 2020-07-03 DIAGNOSIS — J449 Chronic obstructive pulmonary disease, unspecified: Secondary | ICD-10-CM | POA: Diagnosis not present

## 2020-07-03 DIAGNOSIS — U071 COVID-19: Secondary | ICD-10-CM | POA: Diagnosis not present

## 2020-07-17 ENCOUNTER — Other Ambulatory Visit: Payer: Self-pay | Admitting: Nurse Practitioner

## 2020-07-17 DIAGNOSIS — R609 Edema, unspecified: Secondary | ICD-10-CM

## 2020-07-20 ENCOUNTER — Other Ambulatory Visit: Payer: Self-pay | Admitting: Nurse Practitioner

## 2020-07-20 DIAGNOSIS — R609 Edema, unspecified: Secondary | ICD-10-CM

## 2020-07-22 ENCOUNTER — Other Ambulatory Visit: Payer: Self-pay | Admitting: Nurse Practitioner

## 2020-07-22 DIAGNOSIS — R609 Edema, unspecified: Secondary | ICD-10-CM

## 2020-08-02 DIAGNOSIS — U071 COVID-19: Secondary | ICD-10-CM | POA: Diagnosis not present

## 2020-08-02 DIAGNOSIS — J449 Chronic obstructive pulmonary disease, unspecified: Secondary | ICD-10-CM | POA: Diagnosis not present

## 2020-08-21 ENCOUNTER — Ambulatory Visit: Payer: Self-pay | Admitting: Nurse Practitioner

## 2020-09-02 DIAGNOSIS — J449 Chronic obstructive pulmonary disease, unspecified: Secondary | ICD-10-CM | POA: Diagnosis not present

## 2020-09-02 DIAGNOSIS — U071 COVID-19: Secondary | ICD-10-CM | POA: Diagnosis not present

## 2020-09-18 ENCOUNTER — Encounter: Payer: Self-pay | Admitting: Nurse Practitioner

## 2020-09-18 ENCOUNTER — Other Ambulatory Visit: Payer: Self-pay

## 2020-09-18 ENCOUNTER — Ambulatory Visit (INDEPENDENT_AMBULATORY_CARE_PROVIDER_SITE_OTHER): Payer: Medicare HMO | Admitting: Nurse Practitioner

## 2020-09-18 VITALS — BP 142/62 | HR 91 | Temp 98.1°F | Resp 20 | Ht 70.0 in | Wt 248.0 lb

## 2020-09-18 DIAGNOSIS — Z6834 Body mass index (BMI) 34.0-34.9, adult: Secondary | ICD-10-CM | POA: Diagnosis not present

## 2020-09-18 DIAGNOSIS — E785 Hyperlipidemia, unspecified: Secondary | ICD-10-CM | POA: Diagnosis not present

## 2020-09-18 DIAGNOSIS — Z125 Encounter for screening for malignant neoplasm of prostate: Secondary | ICD-10-CM | POA: Diagnosis not present

## 2020-09-18 DIAGNOSIS — G629 Polyneuropathy, unspecified: Secondary | ICD-10-CM

## 2020-09-18 DIAGNOSIS — R609 Edema, unspecified: Secondary | ICD-10-CM | POA: Diagnosis not present

## 2020-09-18 DIAGNOSIS — I10 Essential (primary) hypertension: Secondary | ICD-10-CM

## 2020-09-18 DIAGNOSIS — J410 Simple chronic bronchitis: Secondary | ICD-10-CM

## 2020-09-18 DIAGNOSIS — Z23 Encounter for immunization: Secondary | ICD-10-CM | POA: Diagnosis not present

## 2020-09-18 MED ORDER — ATORVASTATIN CALCIUM 20 MG PO TABS: 20.0000 mg | ORAL_TABLET | Freq: Every evening | ORAL | 1 refills | Status: DC

## 2020-09-18 MED ORDER — GABAPENTIN 300 MG PO CAPS: 300.0000 mg | ORAL_CAPSULE | Freq: Every day | ORAL | 1 refills | Status: DC

## 2020-09-18 MED ORDER — FUROSEMIDE 20 MG PO TABS: 20.0000 mg | ORAL_TABLET | Freq: Every day | ORAL | 1 refills | Status: DC

## 2020-09-18 MED ORDER — LISINOPRIL 40 MG PO TABS: 40.0000 mg | ORAL_TABLET | Freq: Every day | ORAL | 1 refills | Status: DC

## 2020-09-18 MED ORDER — FLUTICASONE-SALMETEROL 250-50 MCG/DOSE IN AEPB: INHALATION_SPRAY | RESPIRATORY_TRACT | 1 refills | Status: DC

## 2020-09-18 NOTE — Progress Notes (Signed)
Subjective:    Patient ID: Neil Smith, male    DOB: 1941-07-01, 79 y.o.   MRN: [id number]   Chief Complaint: Medical Management of Chronic Issues    HPI:  1. Primary hypertension Does not check BP at home. Denies chest pain, headaches, dizziness. Does try to watch sodium in diet. BP Readings from Last 3 Encounters:  02/03/20 (!) 146/71  10/30/19 125/74  04/30/18 113/61    2. Simple chronic bronchitis (HCC) Has been on 3L of oxygen since January. Denies cough, SOB. Does not check oxygen levels at home. Uses advair daily. Does not need to use the albuterol inhaler often.  3. Hyperlipidemia with target LDL less than 100 Does not really watch diet.  Lab Results  Component Value Date   CHOL 110 02/03/2020   HDL 31 (L) 02/03/2020   LDLCALC 53 02/03/2020   TRIG 150 (H) 02/03/2020   CHOLHDL 3.5 02/03/2020    4. Neuropathy Gabapentin helps but he does still have some tingling in his feet.  5. Peripheral edema That has been much better since he has been on oxygen.  6. BMI 34.0-34.9,adult Has gained 12 pounds since last visit but does have his portable oxygen with him. Wt Readings from Last 3 Encounters:  09/18/20 248 lb (112.5 kg)  02/03/20 236 lb (107 kg)  04/30/18 254 lb (115.2 kg)   BMI Readings from Last 3 Encounters:  09/18/20 35.58 kg/m  02/03/20 33.86 kg/m  04/30/18 36.45 kg/m     Outpatient Encounter Medications as of 09/18/2020  Medication Sig  . albuterol (VENTOLIN HFA) 108 (90 Base) MCG/ACT inhaler TAKE 2 PUFFS BY MOUTH EVERY 6 HOURS AS NEEDED FOR WHEEZE OR SHORTNESS OF BREATH  . atorvastatin (LIPITOR) 20 MG tablet Take 1 tablet (20 mg total) by mouth every evening.  . Fluticasone-Salmeterol (ADVAIR DISKUS) 250-50 MCG/DOSE AEPB TAKE 1 PUFF BY MOUTH TWICE A DAY  . furosemide (LASIX) 20 MG tablet Take 1 tablet (20 mg total) by mouth daily.  Marland Kitchen gabapentin (NEURONTIN) 300 MG capsule Take 1 capsule (300 mg total) by mouth at bedtime.  Marland Kitchen lisinopril  (ZESTRIL) 40 MG tablet Take 1 tablet (40 mg total) by mouth daily.  Marland Kitchen Spacer/Aero-Holding Chambers (OPTICHAMBER FACE MASK-LARGE) MISC 1 applicator by Does not apply route as needed.   No facility-administered encounter medications on file as of 09/18/2020.    No past surgical history on file.  Family History  Problem Relation Age of Onset  . Dementia Mother 76  . Heart attack Brother   . Hyperlipidemia Son   . Heart attack Brother   . Alcohol abuse Brother   . Cancer Brother 48       Lung  . Hyperlipidemia Son     New complaints: None  Social history: Lives alone. Has been living by himself for about 6 months.  Controlled substance contract: N/A   Review of Systems  Constitutional: Negative.   HENT: Negative.   Eyes: Negative.   Respiratory: Negative.   Cardiovascular: Negative.   Gastrointestinal: Negative.   Genitourinary: Negative.   Musculoskeletal: Negative.   Skin: Negative.   Neurological: Negative.   Psychiatric/Behavioral: Negative.        Objective:   Physical Exam Vitals and nursing note reviewed.  Constitutional:      Appearance: Normal appearance.  HENT:     Head: Normocephalic.     Right Ear: Tympanic membrane normal.     Left Ear: Tympanic membrane normal.     Nose: Nose normal.  Mouth/Throat:     Mouth: Mucous membranes are moist.     Pharynx: Oropharynx is clear.  Eyes:     Conjunctiva/sclera: Conjunctivae normal.     Pupils: Pupils are equal, round, and reactive to light.  Cardiovascular:     Rate and Rhythm: Normal rate and regular rhythm.     Pulses: Normal pulses.     Heart sounds: Normal heart sounds.  Pulmonary:     Effort: Pulmonary effort is normal.     Breath sounds: Normal breath sounds.     Comments: On 3L nasal cannula Abdominal:     General: Bowel sounds are normal.     Palpations: Abdomen is soft.  Musculoskeletal:        General: Normal range of motion.     Cervical back: Normal range of motion.  Skin:     General: Skin is warm and dry.  Neurological:     General: No focal deficit present.     Mental Status: He is alert and oriented to person, place, and time.  Psychiatric:        Mood and Affect: Mood normal.        Behavior: Behavior normal.    BP (!) 142/62 (BP Location: Left Arm, Cuff Size: Large)   Pulse 91   Temp 98.1 F (36.7 C) (Temporal)   Resp 20   Ht 5\' 10"  (1.778 m)   Wt 248 lb (112.5 kg)   SpO2 97% Comment: 3 liters  BMI 35.58 kg/m      Assessment & Plan:  Neil Smith comes in today with chief complaint of Medical Management of Chronic Issues   Diagnosis and orders addressed:  1. Primary hypertension Low sodium diet - lisinopril (ZESTRIL) 40 MG tablet; Take 1 tablet (40 mg total) by mouth daily.  Dispense: 90 tablet; Refill: 1  2. Simple chronic bronchitis (HCC) Continue oxygen - Fluticasone-Salmeterol (ADVAIR DISKUS) 250-50 MCG/DOSE AEPB; TAKE 1 PUFF BY MOUTH TWICE A DAY  Dispense: 180 each; Refill: 1  3. Hyperlipidemia with target LDL less than 100 Low fat diet - atorvastatin (LIPITOR) 20 MG tablet; Take 1 tablet (20 mg total) by mouth every evening.  Dispense: 90 tablet; Refill: 1  4. Neuropathy Do not go barefooted - gabapentin (NEURONTIN) 300 MG capsule; Take 1 capsule (300 mg total) by mouth at bedtime.  Dispense: 90 capsule; Refill: 1  5. Peripheral edema Elevate legs when sitting - furosemide (LASIX) 20 MG tablet; Take 1 tablet (20 mg total) by mouth daily.  Dispense: 90 tablet; Refill: 1  6. BMI 34.0-34.9,adult Discussed diet and exercise for person with BMI >25 Will recheck weight in 3-6 months    Labs pending Health Maintenance reviewed Diet and exercise encouraged  Follow up plan: 6 months   Mary-Margaret 11-15-1974, FNP

## 2020-09-18 NOTE — Patient Instructions (Signed)
DASH Eating Plan DASH stands for "Dietary Approaches to Stop Hypertension." The DASH eating plan is a healthy eating plan that has been shown to reduce high blood pressure (hypertension). It may also reduce your risk for type 2 diabetes, heart disease, and stroke. The DASH eating plan may also help with weight loss. What are tips for following this plan?  General guidelines  Avoid eating more than 2,300 mg (milligrams) of salt (sodium) a day. If you have hypertension, you may need to reduce your sodium intake to 1,500 mg a day.  Limit alcohol intake to no more than 1 drink a day for nonpregnant women and 2 drinks a day for men. One drink equals 12 oz of beer, 5 oz of wine, or 1 oz of hard liquor.  Work with your health care provider to maintain a healthy body weight or to lose weight. Ask what an ideal weight is for you.  Get at least 30 minutes of exercise that causes your heart to beat faster (aerobic exercise) most days of the week. Activities may include walking, swimming, or biking.  Work with your health care provider or diet and nutrition specialist (dietitian) to adjust your eating plan to your individual calorie needs. Reading food labels   Check food labels for the amount of sodium per serving. Choose foods with less than 5 percent of the Daily Value of sodium. Generally, foods with less than 300 mg of sodium per serving fit into this eating plan.  To find whole grains, look for the word "whole" as the first word in the ingredient list. Shopping  Buy products labeled as "low-sodium" or "no salt added."  Buy fresh foods. Avoid canned foods and premade or frozen meals. Cooking  Avoid adding salt when cooking. Use salt-free seasonings or herbs instead of table salt or sea salt. Check with your health care provider or pharmacist before using salt substitutes.  Do not fry foods. Cook foods using healthy methods such as baking, boiling, grilling, and broiling instead.  Cook with  heart-healthy oils, such as olive, canola, soybean, or sunflower oil. Meal planning  Eat a balanced diet that includes: ? 5 or more servings of fruits and vegetables each day. At each meal, try to fill half of your plate with fruits and vegetables. ? Up to 6-8 servings of whole grains each day. ? Less than 6 oz of lean meat, poultry, or fish each day. A 3-oz serving of meat is about the same size as a deck of cards. One egg equals 1 oz. ? 2 servings of low-fat dairy each day. ? A serving of nuts, seeds, or beans 5 times each week. ? Heart-healthy fats. Healthy fats called Omega-3 fatty acids are found in foods such as flaxseeds and coldwater fish, like sardines, salmon, and mackerel.  Limit how much you eat of the following: ? Canned or prepackaged foods. ? Food that is high in trans fat, such as fried foods. ? Food that is high in saturated fat, such as fatty meat. ? Sweets, desserts, sugary drinks, and other foods with added sugar. ? Full-fat dairy products.  Do not salt foods before eating.  Try to eat at least 2 vegetarian meals each week.  Eat more home-cooked food and less restaurant, buffet, and fast food.  When eating at a restaurant, ask that your food be prepared with less salt or no salt, if possible. What foods are recommended? The items listed may not be a complete list. Talk with your dietitian about   what dietary choices are best for you. Grains Whole-grain or whole-wheat bread. Whole-grain or whole-wheat pasta. Brown rice. Oatmeal. Quinoa. Bulgur. Whole-grain and low-sodium cereals. Pita bread. Low-fat, low-sodium crackers. Whole-wheat flour tortillas. Vegetables Fresh or frozen vegetables (raw, steamed, roasted, or grilled). Low-sodium or reduced-sodium tomato and vegetable juice. Low-sodium or reduced-sodium tomato sauce and tomato paste. Low-sodium or reduced-sodium canned vegetables. Fruits All fresh, dried, or frozen fruit. Canned fruit in natural juice (without  added sugar). Meat and other protein foods Skinless chicken or turkey. Ground chicken or turkey. Pork with fat trimmed off. Fish and seafood. Egg whites. Dried beans, peas, or lentils. Unsalted nuts, nut butters, and seeds. Unsalted canned beans. Lean cuts of beef with fat trimmed off. Low-sodium, lean deli meat. Dairy Low-fat (1%) or fat-free (skim) milk. Fat-free, low-fat, or reduced-fat cheeses. Nonfat, low-sodium ricotta or cottage cheese. Low-fat or nonfat yogurt. Low-fat, low-sodium cheese. Fats and oils Soft margarine without trans fats. Vegetable oil. Low-fat, reduced-fat, or light mayonnaise and salad dressings (reduced-sodium). Canola, safflower, olive, soybean, and sunflower oils. Avocado. Seasoning and other foods Herbs. Spices. Seasoning mixes without salt. Unsalted popcorn and pretzels. Fat-free sweets. What foods are not recommended? The items listed may not be a complete list. Talk with your dietitian about what dietary choices are best for you. Grains Baked goods made with fat, such as croissants, muffins, or some breads. Dry pasta or rice meal packs. Vegetables Creamed or fried vegetables. Vegetables in a cheese sauce. Regular canned vegetables (not low-sodium or reduced-sodium). Regular canned tomato sauce and paste (not low-sodium or reduced-sodium). Regular tomato and vegetable juice (not low-sodium or reduced-sodium). Pickles. Olives. Fruits Canned fruit in a light or heavy syrup. Fried fruit. Fruit in cream or butter sauce. Meat and other protein foods Fatty cuts of meat. Ribs. Fried meat. Bacon. Sausage. Bologna and other processed lunch meats. Salami. Fatback. Hotdogs. Bratwurst. Salted nuts and seeds. Canned beans with added salt. Canned or smoked fish. Whole eggs or egg yolks. Chicken or turkey with skin. Dairy Whole or 2% milk, cream, and half-and-half. Whole or full-fat cream cheese. Whole-fat or sweetened yogurt. Full-fat cheese. Nondairy creamers. Whipped toppings.  Processed cheese and cheese spreads. Fats and oils Butter. Stick margarine. Lard. Shortening. Ghee. Bacon fat. Tropical oils, such as coconut, palm kernel, or palm oil. Seasoning and other foods Salted popcorn and pretzels. Onion salt, garlic salt, seasoned salt, table salt, and sea salt. Worcestershire sauce. Tartar sauce. Barbecue sauce. Teriyaki sauce. Soy sauce, including reduced-sodium. Steak sauce. Canned and packaged gravies. Fish sauce. Oyster sauce. Cocktail sauce. Horseradish that you find on the shelf. Ketchup. Mustard. Meat flavorings and tenderizers. Bouillon cubes. Hot sauce and Tabasco sauce. Premade or packaged marinades. Premade or packaged taco seasonings. Relishes. Regular salad dressings. Where to find more information:  National Heart, Lung, and Blood Institute: www.nhlbi.nih.gov  American Heart Association: www.heart.org Summary  The DASH eating plan is a healthy eating plan that has been shown to reduce high blood pressure (hypertension). It may also reduce your risk for type 2 diabetes, heart disease, and stroke.  With the DASH eating plan, you should limit salt (sodium) intake to 2,300 mg a day. If you have hypertension, you may need to reduce your sodium intake to 1,500 mg a day.  When on the DASH eating plan, aim to eat more fresh fruits and vegetables, whole grains, lean proteins, low-fat dairy, and heart-healthy fats.  Work with your health care provider or diet and nutrition specialist (dietitian) to adjust your eating plan to your   individual calorie needs. This information is not intended to replace advice given to you by your health care provider. Make sure you discuss any questions you have with your health care provider. Document Revised: 09/25/2017 Document Reviewed: 10/06/2016 Elsevier Patient Education  2020 Else[employer] Inc.  

## 2020-09-18 NOTE — Addendum Note (Signed)
Addended by: Cleda Daub on: 09/18/2020 04:34 PM   Modules accepted: Orders

## 2020-09-19 LAB — LIPID PANEL
Chol/HDL Ratio: 4 ratio (ref 0.0–5.0)
Cholesterol, Total: 120 mg/dL (ref 100–199)
HDL: 30 mg/dL — ABNORMAL LOW (ref >39)
LDL Chol Calc (NIH): 43 mg/dL (ref 0–99)
Triglycerides: 313 mg/dL — ABNORMAL HIGH (ref 0–149)
VLDL Cholesterol Cal: 47 mg/dL — ABNORMAL HIGH (ref 5–40)

## 2020-09-19 LAB — CBC WITH DIFFERENTIAL/PLATELET
Basophils Absolute: 0 10*3/uL (ref 0.0–0.2)
Basos: 1 %
EOS (ABSOLUTE): 0.2 10*3/uL (ref 0.0–0.4)
Eos: 2 %
Hematocrit: 40.5 % (ref 37.5–51.0)
Hemoglobin: 13.5 g/dL (ref 13.0–17.7)
Immature Grans (Abs): 0 10*3/uL (ref 0.0–0.1)
Immature Granulocytes: 0 %
Lymphocytes Absolute: 1.7 10*3/uL (ref 0.7–3.1)
Lymphs: 23 %
MCH: 30.6 pg (ref 26.6–33.0)
MCHC: 33.3 g/dL (ref 31.5–35.7)
MCV: 92 fL (ref 79–97)
Monocytes Absolute: 0.4 10*3/uL (ref 0.1–0.9)
Monocytes: 6 %
Neutrophils Absolute: 5 10*3/uL (ref 1.4–7.0)
Neutrophils: 68 %
Platelets: 244 10*3/uL (ref 150–450)
RBC: 4.41 x10E6/uL (ref 4.14–5.80)
RDW: 12.3 % (ref 11.6–15.4)
WBC: 7.4 10*3/uL (ref 3.4–10.8)

## 2020-09-19 LAB — CMP14+EGFR
ALT: 23 IU/L (ref 0–44)
AST: 17 IU/L (ref 0–40)
Albumin/Globulin Ratio: 2 (ref 1.2–2.2)
Albumin: 4.6 g/dL (ref 3.7–4.7)
Alkaline Phosphatase: 66 IU/L (ref 44–121)
BUN/Creatinine Ratio: 13 (ref 10–24)
BUN: 19 mg/dL (ref 8–27)
Bilirubin Total: 0.3 mg/dL (ref 0.0–1.2)
CO2: 26 mmol/L (ref 20–29)
Calcium: 9.6 mg/dL (ref 8.6–10.2)
Chloride: 103 mmol/L (ref 96–106)
Creatinine, Ser: 1.46 mg/dL — ABNORMAL HIGH (ref 0.76–1.27)
GFR calc Af Amer: 52 mL/min/{1.73_m2} — ABNORMAL LOW (ref >59)
GFR calc non Af Amer: 45 mL/min/{1.73_m2} — ABNORMAL LOW (ref >59)
Globulin, Total: 2.3 g/dL (ref 1.5–4.5)
Glucose: 99 mg/dL (ref 65–99)
Potassium: 5 mmol/L (ref 3.5–5.2)
Sodium: 141 mmol/L (ref 134–144)
Total Protein: 6.9 g/dL (ref 6.0–8.5)

## 2020-09-19 LAB — PSA, TOTAL AND FREE
PSA, Free Pct: 24.5 %
PSA, Free: 0.27 ng/mL
Prostate Specific Ag, Serum: 1.1 ng/mL (ref 0.0–4.0)

## 2020-10-02 DIAGNOSIS — J449 Chronic obstructive pulmonary disease, unspecified: Secondary | ICD-10-CM | POA: Diagnosis not present

## 2020-10-02 DIAGNOSIS — U071 COVID-19: Secondary | ICD-10-CM | POA: Diagnosis not present

## 2020-11-02 DIAGNOSIS — J449 Chronic obstructive pulmonary disease, unspecified: Secondary | ICD-10-CM | POA: Diagnosis not present

## 2020-12-03 DIAGNOSIS — J449 Chronic obstructive pulmonary disease, unspecified: Secondary | ICD-10-CM | POA: Diagnosis not present

## 2020-12-18 ENCOUNTER — Other Ambulatory Visit: Payer: Self-pay | Admitting: Nurse Practitioner

## 2020-12-18 DIAGNOSIS — R609 Edema, unspecified: Secondary | ICD-10-CM

## 2020-12-31 DIAGNOSIS — J449 Chronic obstructive pulmonary disease, unspecified: Secondary | ICD-10-CM | POA: Diagnosis not present

## 2021-01-30 ENCOUNTER — Other Ambulatory Visit: Payer: Self-pay | Admitting: Nurse Practitioner

## 2021-01-31 DIAGNOSIS — J449 Chronic obstructive pulmonary disease, unspecified: Secondary | ICD-10-CM | POA: Diagnosis not present

## 2021-03-02 DIAGNOSIS — J449 Chronic obstructive pulmonary disease, unspecified: Secondary | ICD-10-CM | POA: Diagnosis not present

## 2021-03-06 ENCOUNTER — Other Ambulatory Visit: Payer: Self-pay | Admitting: Nurse Practitioner

## 2021-03-06 DIAGNOSIS — E785 Hyperlipidemia, unspecified: Secondary | ICD-10-CM

## 2021-03-06 DIAGNOSIS — I10 Essential (primary) hypertension: Secondary | ICD-10-CM

## 2021-03-14 ENCOUNTER — Other Ambulatory Visit: Payer: Self-pay | Admitting: Nurse Practitioner

## 2021-03-14 ENCOUNTER — Ambulatory Visit: Payer: Self-pay | Admitting: Nurse Practitioner

## 2021-03-14 DIAGNOSIS — G629 Polyneuropathy, unspecified: Secondary | ICD-10-CM

## 2021-04-02 DIAGNOSIS — J449 Chronic obstructive pulmonary disease, unspecified: Secondary | ICD-10-CM | POA: Diagnosis not present

## 2021-05-02 DIAGNOSIS — J449 Chronic obstructive pulmonary disease, unspecified: Secondary | ICD-10-CM | POA: Diagnosis not present

## 2021-05-31 ENCOUNTER — Other Ambulatory Visit: Payer: Self-pay | Admitting: Nurse Practitioner

## 2021-05-31 DIAGNOSIS — R609 Edema, unspecified: Secondary | ICD-10-CM

## 2021-05-31 DIAGNOSIS — E785 Hyperlipidemia, unspecified: Secondary | ICD-10-CM

## 2021-05-31 DIAGNOSIS — I10 Essential (primary) hypertension: Secondary | ICD-10-CM

## 2021-06-02 DIAGNOSIS — J449 Chronic obstructive pulmonary disease, unspecified: Secondary | ICD-10-CM | POA: Diagnosis not present

## 2021-06-24 ENCOUNTER — Other Ambulatory Visit: Payer: Self-pay | Admitting: Nurse Practitioner

## 2021-06-24 DIAGNOSIS — I10 Essential (primary) hypertension: Secondary | ICD-10-CM

## 2021-06-24 DIAGNOSIS — R609 Edema, unspecified: Secondary | ICD-10-CM

## 2021-06-24 NOTE — Telephone Encounter (Signed)
MMM NTBS 30 days given 05/31/21

## 2021-06-24 NOTE — Telephone Encounter (Signed)
Family aware = will call back for appt

## 2021-06-26 IMAGING — DX DG CHEST 2V
2 series · 2 of 2 positions shown · non-contrast
Comparison: April 02, 2018

CLINICAL DATA: Shortness of breath

EXAM:
CHEST - 2 VIEW

[chest pa]
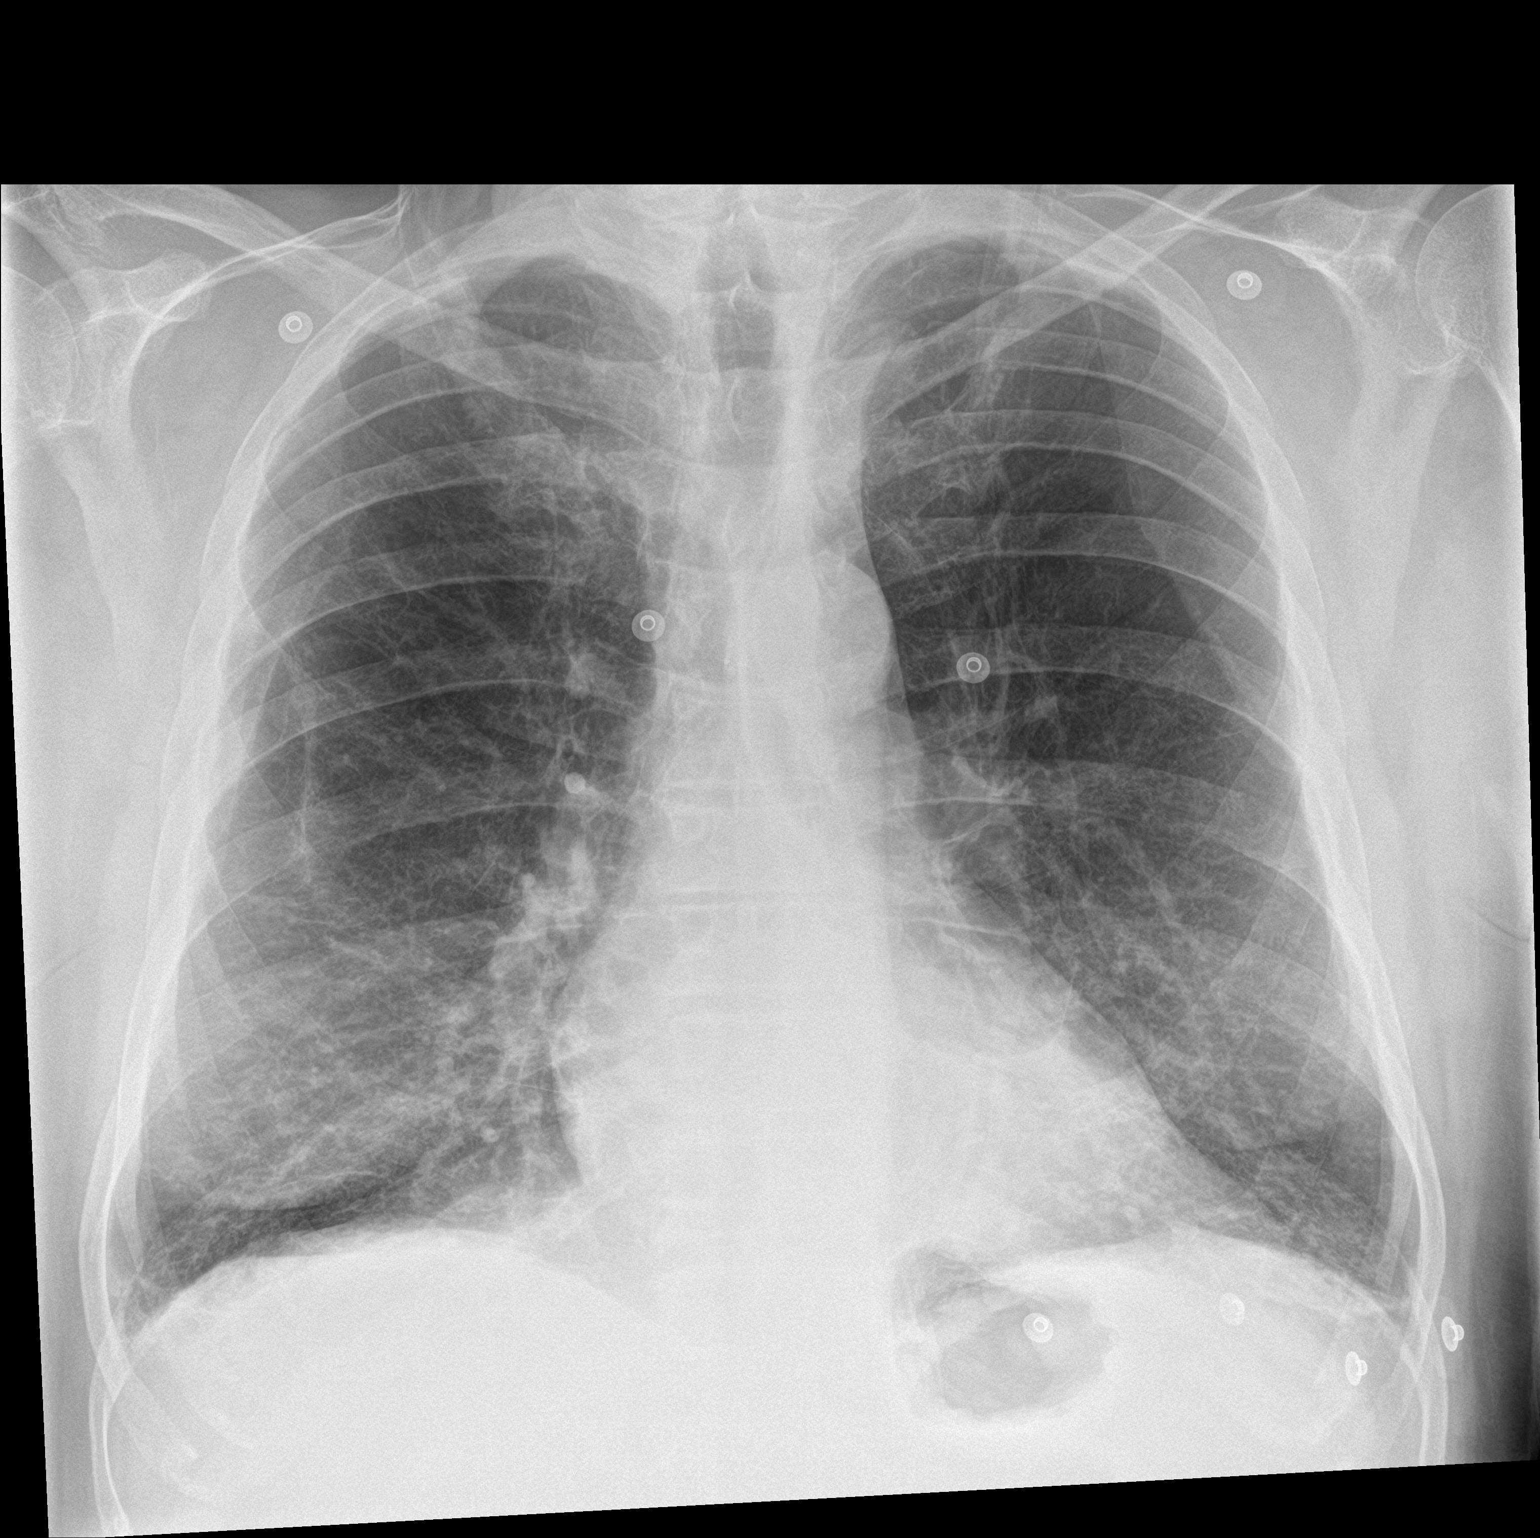

[chest lat]
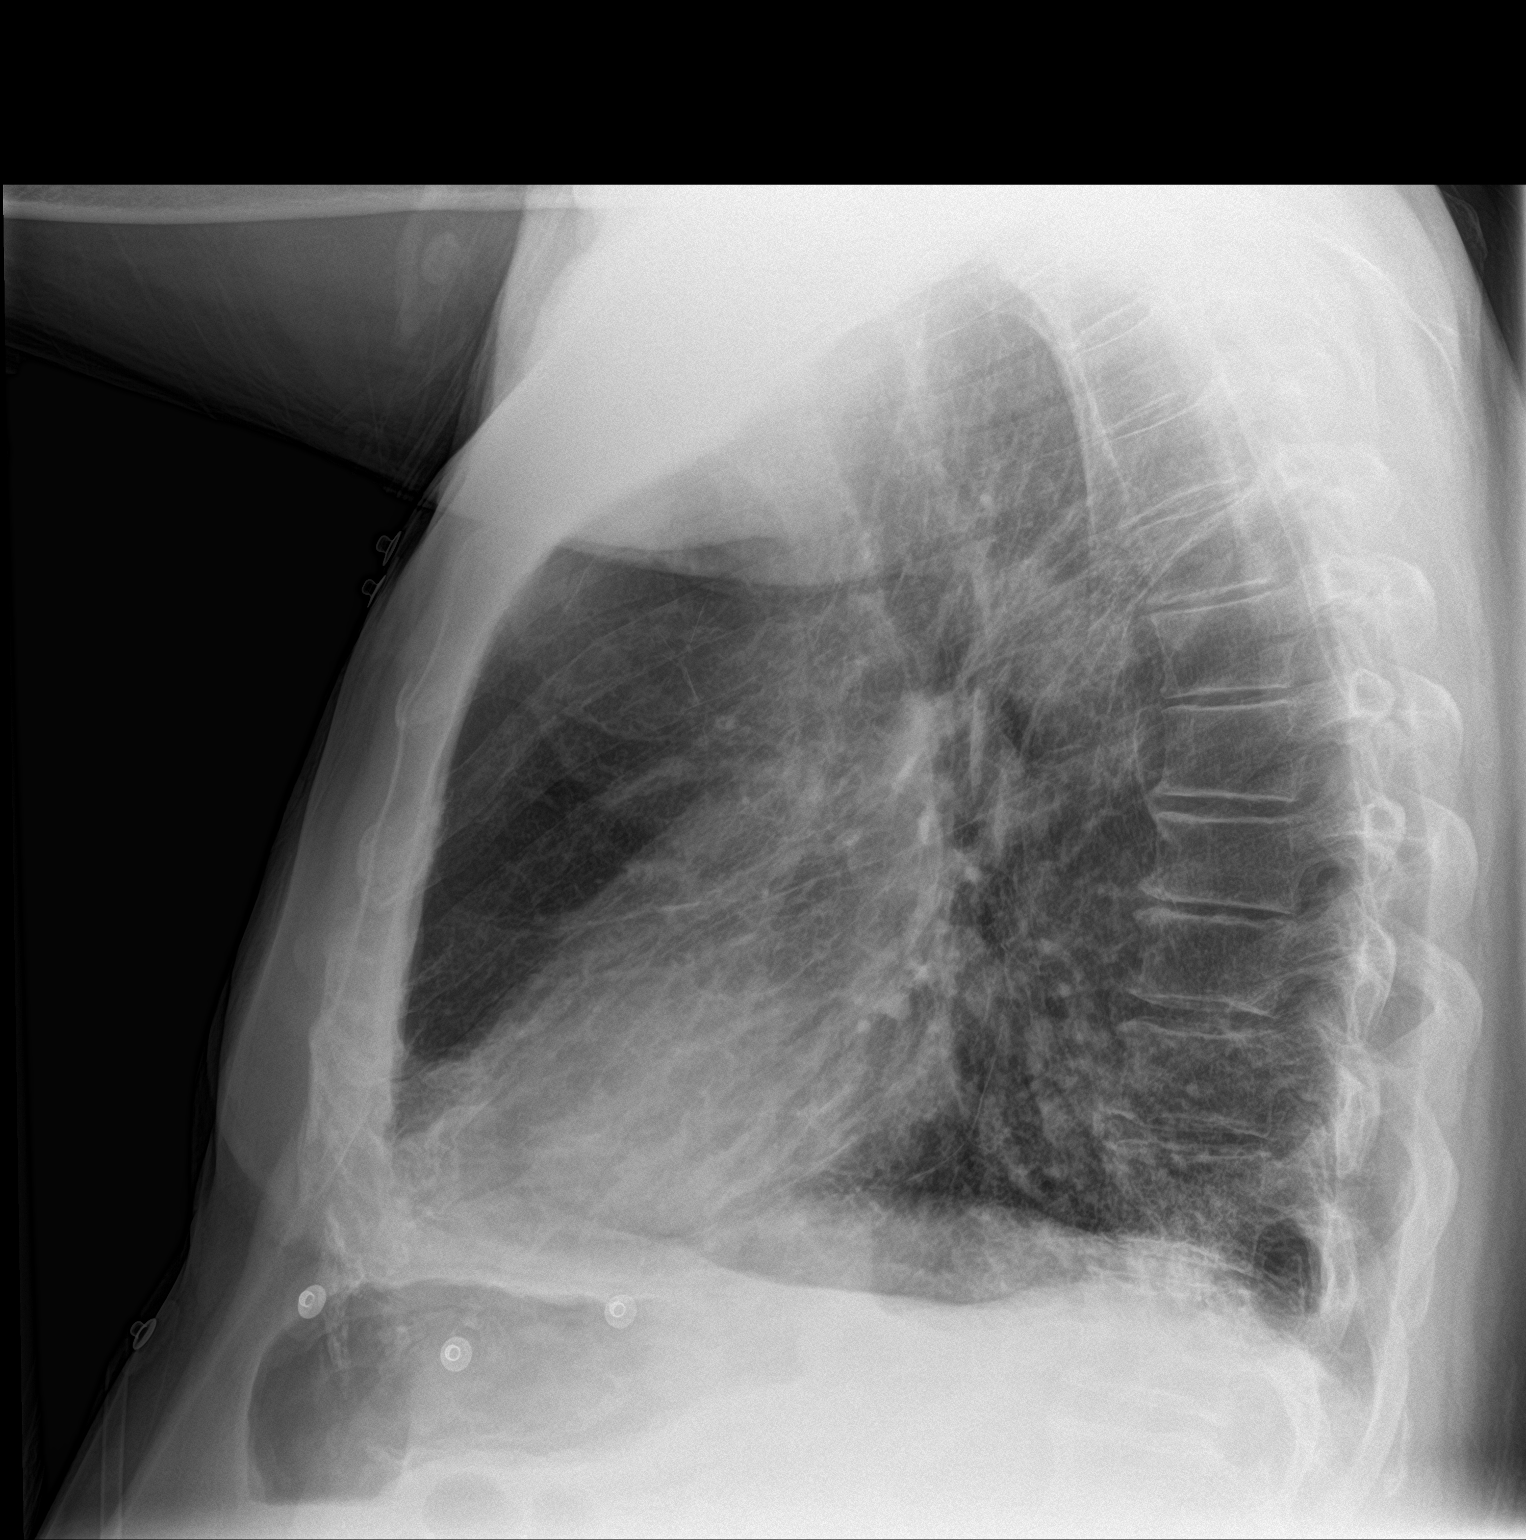

[2 of 2 positions shown; findings below may reference images not displayed]

FINDINGS: Emphysematous changes and lung hyperinflation is again noted. There
are coarse lung markings at the lung bases bilaterally favored to
represent atelectasis or scarring. There is no large focal
infiltrate. There is no pneumothorax. The heart size is similar to
prior study. There is no acute osseous abnormality.
IMPRESSION: COPD without evidence for an acute cardiopulmonary process.

## 2021-07-02 ENCOUNTER — Other Ambulatory Visit: Payer: Self-pay

## 2021-07-02 ENCOUNTER — Encounter: Payer: Self-pay | Admitting: Nurse Practitioner

## 2021-07-02 ENCOUNTER — Ambulatory Visit (INDEPENDENT_AMBULATORY_CARE_PROVIDER_SITE_OTHER): Payer: Medicare HMO | Admitting: Nurse Practitioner

## 2021-07-02 VITALS — BP 135/65 | HR 69 | Temp 98.6°F | Resp 20 | Ht 70.0 in | Wt 227.0 lb

## 2021-07-02 DIAGNOSIS — R609 Edema, unspecified: Secondary | ICD-10-CM

## 2021-07-02 DIAGNOSIS — Z6832 Body mass index (BMI) 32.0-32.9, adult: Secondary | ICD-10-CM

## 2021-07-02 DIAGNOSIS — E785 Hyperlipidemia, unspecified: Secondary | ICD-10-CM

## 2021-07-02 DIAGNOSIS — J410 Simple chronic bronchitis: Secondary | ICD-10-CM

## 2021-07-02 DIAGNOSIS — I10 Essential (primary) hypertension: Secondary | ICD-10-CM

## 2021-07-02 DIAGNOSIS — G629 Polyneuropathy, unspecified: Secondary | ICD-10-CM

## 2021-07-02 LAB — CBC WITH DIFFERENTIAL/PLATELET
Basophils Absolute: 0 10*3/uL (ref 0.0–0.2)
Basos: 1 %
EOS (ABSOLUTE): 0.1 10*3/uL (ref 0.0–0.4)
Eos: 1 %
Hematocrit: 39.2 % (ref 37.5–51.0)
Hemoglobin: 13.3 g/dL (ref 13.0–17.7)
Immature Grans (Abs): 0 10*3/uL (ref 0.0–0.1)
Immature Granulocytes: 0 %
Lymphocytes Absolute: 1.5 10*3/uL (ref 0.7–3.1)
Lymphs: 18 %
MCH: 30.8 pg (ref 26.6–33.0)
MCHC: 33.9 g/dL (ref 31.5–35.7)
MCV: 91 fL (ref 79–97)
Monocytes Absolute: 0.5 10*3/uL (ref 0.1–0.9)
Monocytes: 6 %
Neutrophils Absolute: 6.2 10*3/uL (ref 1.4–7.0)
Neutrophils: 74 %
Platelets: 265 10*3/uL (ref 150–450)
RBC: 4.32 x10E6/uL (ref 4.14–5.80)
RDW: 12.9 % (ref 11.6–15.4)
WBC: 8.3 10*3/uL (ref 3.4–10.8)

## 2021-07-02 LAB — CMP14+EGFR
ALT: 22 IU/L (ref 0–44)
AST: 22 IU/L (ref 0–40)
Albumin/Globulin Ratio: 2.1 (ref 1.2–2.2)
Albumin: 4.4 g/dL (ref 3.7–4.7)
Alkaline Phosphatase: 61 IU/L (ref 44–121)
BUN/Creatinine Ratio: 9 — ABNORMAL LOW (ref 10–24)
BUN: 14 mg/dL (ref 8–27)
Bilirubin Total: 0.4 mg/dL (ref 0.0–1.2)
CO2: 23 mmol/L (ref 20–29)
Calcium: 9.6 mg/dL (ref 8.6–10.2)
Chloride: 106 mmol/L (ref 96–106)
Creatinine, Ser: 1.55 mg/dL — ABNORMAL HIGH (ref 0.76–1.27)
Globulin, Total: 2.1 g/dL (ref 1.5–4.5)
Glucose: 97 mg/dL (ref 65–99)
Potassium: 5.2 mmol/L (ref 3.5–5.2)
Sodium: 142 mmol/L (ref 134–144)
Total Protein: 6.5 g/dL (ref 6.0–8.5)
eGFR: 45 mL/min/{1.73_m2} — ABNORMAL LOW (ref >59)

## 2021-07-02 LAB — LIPID PANEL
Chol/HDL Ratio: 3.1 ratio (ref 0.0–5.0)
Cholesterol, Total: 120 mg/dL (ref 100–199)
HDL: 39 mg/dL — ABNORMAL LOW (ref >39)
LDL Chol Calc (NIH): 59 mg/dL (ref 0–99)
Triglycerides: 123 mg/dL (ref 0–149)
VLDL Cholesterol Cal: 22 mg/dL (ref 5–40)

## 2021-07-02 MED ORDER — FLUTICASONE-SALMETEROL 250-50 MCG/ACT IN AEPB: 1.0000 | INHALATION_SPRAY | Freq: Two times a day (BID) | RESPIRATORY_TRACT | 5 refills | Status: DC

## 2021-07-02 MED ORDER — LISINOPRIL 40 MG PO TABS: 40.0000 mg | ORAL_TABLET | Freq: Every day | ORAL | 0 refills | Status: DC

## 2021-07-02 MED ORDER — GABAPENTIN 300 MG PO CAPS: 300.0000 mg | ORAL_CAPSULE | Freq: Every day | ORAL | 0 refills | Status: DC

## 2021-07-02 MED ORDER — FUROSEMIDE 20 MG PO TABS: 20.0000 mg | ORAL_TABLET | Freq: Every day | ORAL | 0 refills | Status: DC

## 2021-07-02 MED ORDER — ATORVASTATIN CALCIUM 20 MG PO TABS: 20.0000 mg | ORAL_TABLET | Freq: Every evening | ORAL | 0 refills | Status: DC

## 2021-07-02 NOTE — Progress Notes (Signed)
Subjective:    Patient ID: Neil Smith, male    DOB: [birth year], 80 y.o.   MRN: [id number]   Chief Complaint: Medical Management of Chronic Issues    HPI:  1. Primary hypertension No c/o chest pain, sob or headache. Does not check blood pressure at home  BP Readings from Last 3 Encounters:  07/02/21 135/65  09/18/20 (!) 142/62  02/03/20 (!) 146/71     2. Hyperlipidemia with target LDL less than 100 Does not really watch diet and does very little to  no exercise. Lab Results  Component Value Date   CHOL 120 09/18/2020   HDL 30 (L) 09/18/2020   LDLCALC 43 09/18/2020   TRIG 313 (H) 09/18/2020   CHOLHDL 4.0 09/18/2020      3. Simple chronic bronchitis ([place]) Denies any recent cough. Is advair daily and has not needed to use albuterol lately. Is on oxygen at 3l 24/7. He doe sot see pulmonology.   4. Peripheral edema Has some swelling daily  5. Neuropathy Has constant numbness and burning in bil feet. Neurontin really helps  6. BMI 35.0-35.9,adult Weight is down 19lbs Wt Readings from Last 3 Encounters:  07/02/21 227 lb (103 kg)  09/18/20 248 lb (112.5 kg)  02/03/20 236 lb (107 kg)   BMI Readings from Last 3 Encounters:  07/02/21 32.57 kg/m  09/18/20 35.58 kg/m  02/03/20 33.86 kg/m        Outpatient Encounter Medications as of 07/02/2021  Medication Sig   albuterol (VENTOLIN HFA) 108 (90 Base) MCG/ACT inhaler TAKE 2 PUFFS BY MOUTH EVERY 6 HOURS AS NEEDED FOR WHEEZE OR SHORTNESS OF BREATH   atorvastatin (LIPITOR) 20 MG tablet Take 1 tablet (20 mg total) by mouth every evening. (NEEDS TO BE SEEN BEFORE NEXT REFILL)   Fluticasone-Salmeterol (ADVAIR DISKUS) 250-50 MCG/DOSE AEPB TAKE 1 PUFF BY MOUTH TWICE A DAY   furosemide (LASIX) 20 MG tablet Take 1 tablet (20 mg total) by mouth daily. (NEEDS TO BE SEEN BEFORE NEXT REFILL)   gabapentin (NEURONTIN) 300 MG capsule Take 1 capsule (300 mg total) by mouth at bedtime. (NEEDS TO BE SEEN BEFORE NEXT REFILL)    lisinopril (ZESTRIL) 40 MG tablet Take 1 tablet (40 mg total) by mouth daily. (NEEDS TO BE SEEN BEFORE NEXT REFILL)   No facility-administered encounter medications on file as of 07/02/2021.    History reviewed. No pertinent surgical history.  Family History  Problem Relation Age of Onset   Dementia Mother 72   Heart attack Brother    Hyperlipidemia Son    Heart attack Brother    Alcohol abuse Brother    Cancer Brother 27       Lung   Hyperlipidemia Son     New complaints: None today  Social history: Lives by himself. His family check son him daily.  Controlled substance contract: n/a     Review of Systems  Constitutional:  Negative for diaphoresis.  Eyes:  Negative for pain.  Respiratory:  Negative for shortness of breath.   Cardiovascular:  Negative for chest pain, palpitations and leg swelling.  Gastrointestinal:  Negative for abdominal pain.  Endocrine: Negative for polydipsia.  Skin:  Negative for rash.  Neurological:  Negative for dizziness, weakness and headaches.  Hematological:  Does not bruise/bleed easily.  All other systems reviewed and are negative.     Objective:   Physical Exam Vitals and nursing note reviewed.  Constitutional:      Appearance: Normal appearance. He is well-developed.  HENT:     Head: Normocephalic.     Nose: Nose normal.  Eyes:     Pupils: Pupils are equal, round, and reactive to light.  Neck:     Thyroid: No thyroid mass or thyromegaly.     Vascular: No carotid bruit or JVD.     Trachea: Phonation normal.  Cardiovascular:     Rate and Rhythm: Normal rate and regular rhythm.  Pulmonary:     Effort: Pulmonary effort is normal. No respiratory distress.     Breath sounds: Normal breath sounds.     Comments: O2 via nasal canulla  Abdominal:     General: Bowel sounds are normal.     Palpations: Abdomen is soft.     Tenderness: There is no abdominal tenderness.  Musculoskeletal:        General: Normal range of motion.      Cervical back: Normal range of motion and neck supple.  Lymphadenopathy:     Cervical: No cervical adenopathy.  Skin:    General: Skin is warm and dry.  Neurological:     Mental Status: He is alert and oriented to person, place, and time.  Psychiatric:        Behavior: Behavior normal.        Thought Content: Thought content normal.        Judgment: Judgment normal.    BP 135/65   Pulse 69   Temp 98.6 F (37 C) (Temporal)   Resp 20   Ht 5' 10"  (1.778 m)   Wt 227 lb (103 kg)   SpO2 96% Comment: 3 liters O2  BMI 32.57 kg/m        Assessment & Plan:  DORELL GATLIN comes in today with chief complaint of Medical Management of Chronic Issues   Diagnosis and orders addressed:  1. Primary hypertension Loa sodium diet - lisinopril (ZESTRIL) 40 MG tablet; Take 1 tablet (40 mg total) by mouth daily. (NEEDS TO BE SEEN BEFORE NEXT REFILL)  Dispense: 30 tablet; Refill: 0 - CBC with Differential/Platelet - CMP14+EGFR  2. Hyperlipidemia with target LDL less than 100 Low fat diet - atorvastatin (LIPITOR) 20 MG tablet; Take 1 tablet (20 mg total) by mouth every evening. (NEEDS TO BE SEEN BEFORE NEXT REFILL)  Dispense: 30 tablet; Refill: 0 - Lipid panel  3. Simple chronic bronchitis ([facility]) Continue oxygen 24/7 - fluticasone-salmeterol (ADVAIR) 250-50 MCG/ACT AEPB; Inhale 1 puff into the lungs in the morning and at bedtime.  Dispense: 60 each; Refill: 5  4. Peripheral edema Elevate legs when sitting - furosemide (LASIX) 20 MG tablet; Take 1 tablet (20 mg total) by mouth daily. (NEEDS TO BE SEEN BEFORE NEXT REFILL)  Dispense: 30 tablet; Refill: 0  5. Neuropathy Do not go barefooted - gabapentin (NEURONTIN) 300 MG capsule; Take 1 capsule (300 mg total) by mouth at bedtime. (NEEDS TO BE SEEN BEFORE NEXT REFILL)  Dispense: 30 capsule; Refill: 0  6. BMI 32.0-32.9,adult Discussed diet and exercise for person with BMI >25 Will recheck weight in 3-6 months    Labs pending Health  Maintenance reviewed Diet and exercise encouraged  Follow up plan: 6 months     Mary-Margaret Hassell Done, FNP

## 2021-07-02 NOTE — Patient Instructions (Signed)

## 2021-07-03 DIAGNOSIS — J449 Chronic obstructive pulmonary disease, unspecified: Secondary | ICD-10-CM | POA: Diagnosis not present

## 2021-07-04 ENCOUNTER — Telehealth: Payer: Self-pay | Admitting: Nurse Practitioner

## 2021-07-04 NOTE — Telephone Encounter (Signed)
Called to schedule AWV with [facility]. Patients son Mellody Dance stated he will call us back to schedule. He will have to get a free moment to do this with his dad.

## 2021-07-16 ENCOUNTER — Other Ambulatory Visit: Payer: Self-pay | Admitting: Nurse Practitioner

## 2021-07-16 DIAGNOSIS — R609 Edema, unspecified: Secondary | ICD-10-CM

## 2021-07-16 DIAGNOSIS — I10 Essential (primary) hypertension: Secondary | ICD-10-CM

## 2021-07-16 DIAGNOSIS — E785 Hyperlipidemia, unspecified: Secondary | ICD-10-CM

## 2021-08-02 DIAGNOSIS — J449 Chronic obstructive pulmonary disease, unspecified: Secondary | ICD-10-CM | POA: Diagnosis not present

## 2021-08-29 ENCOUNTER — Other Ambulatory Visit: Payer: Self-pay | Admitting: Nurse Practitioner

## 2021-08-29 DIAGNOSIS — G629 Polyneuropathy, unspecified: Secondary | ICD-10-CM

## 2021-08-29 NOTE — Telephone Encounter (Signed)
Pt is going out of town Friday and needs rx today

## 2021-09-02 DIAGNOSIS — J449 Chronic obstructive pulmonary disease, unspecified: Secondary | ICD-10-CM | POA: Diagnosis not present

## 2021-09-09 ENCOUNTER — Ambulatory Visit (INDEPENDENT_AMBULATORY_CARE_PROVIDER_SITE_OTHER): Payer: Medicare HMO

## 2021-09-09 VITALS — Ht 70.0 in | Wt 227.0 lb

## 2021-09-09 DIAGNOSIS — Z0000 Encounter for general adult medical examination without abnormal findings: Secondary | ICD-10-CM

## 2021-09-09 NOTE — Patient Instructions (Signed)
Neil Smith , Thank you for taking time to come for your Medicare Wellness Visit. I appreciate your ongoing commitment to your health goals. Please review the following plan we discussed and let me know if I can assist you in the future.   Screening recommendations/referrals: Colonoscopy: No longer required due to age.  Recommended yearly ophthalmology/optometry visit for glaucoma screening and checkup Recommended yearly dental visit for hygiene and checkup  Vaccinations: Influenza vaccine: Due. Repeat annually  Pneumococcal vaccine: Done 05/09/2015 Tdap vaccine: Due. Repeat in 10 years  Shingles vaccine: Shingrix discussed. Please contact your pharmacy for coverage information.     Covid-19: Declined.   Advanced directives: Advance directive discussed with you today. Even though you declined this today, please call our office should you change your mind, and we can give you the proper paperwork for you to fill out.   Conditions/risks identified: Aim for 30 minutes of exercise or walking each day, drink 6-8 glasses of water and eat lots of fruits and vegetables.   Next appointment: Follow up in one year for your annual wellness visit. 2023.  Preventive Care 13 Years and Older, Male  Preventive care refers to lifestyle choices and visits with your health care provider that can promote health and wellness. What does preventive care include? A yearly physical exam. This is also called an annual well check. Dental exams once or twice a year. Routine eye exams. Ask your health care provider how often you should have your eyes checked. Personal lifestyle choices, including: Daily care of your teeth and gums. Regular physical activity. Eating a healthy diet. Avoiding tobacco and drug use. Limiting alcohol use. Practicing safe sex. Taking low doses of aspirin every day. Taking vitamin and mineral supplements as recommended by your health care provider. What happens during an  annual well check? The services and screenings done by your health care provider during your annual well check will depend on your age, overall health, lifestyle risk factors, and family history of disease. Counseling  Your health care provider may ask you questions about your: Alcohol use. Tobacco use. Drug use. Emotional well-being. Home and relationship well-being. Sexual activity. Eating habits. History of falls. Memory and ability to understand (cognition). Work and work Astronomer. Screening  You may have the following tests or measurements: Height, weight, and BMI. Blood pressure. Lipid and cholesterol levels. These may be checked every 5 years, or more frequently if you are over 57 years old. Skin check. Lung cancer screening. You may have this screening every year starting at age 90 if you have a 30-pack-year history of smoking and currently smoke or have quit within the past 15 years. Fecal occult blood test (FOBT) of the stool. You may have this test every year starting at age 35. Flexible sigmoidoscopy or colonoscopy. You may have a sigmoidoscopy every 5 years or a colonoscopy every 10 years starting at age 80. Prostate cancer screening. Recommendations will vary depending on your family history and other risks. Hepatitis C blood test. Hepatitis B blood test. Sexually transmitted disease (STD) testing. Diabetes screening. This is done by checking your blood sugar (glucose) after you have not eaten for a while (fasting). You may have this done every 1-3 years. Abdominal aortic aneurysm (AAA) screening. You may need this if you are a current or former smoker. Osteoporosis. You may be screened starting at age 49 if you are at high risk. Talk with your health care provider about your test results, treatment options, and if necessary, the need  for more tests. Vaccines  Your health care provider may recommend certain vaccines, such as: Influenza vaccine. This is recommended  every year. Tetanus, diphtheria, and acellular pertussis (Tdap, Td) vaccine. You may need a Td booster every 10 years. Zoster vaccine. You may need this after age 68. Pneumococcal 13-valent conjugate (PCV13) vaccine. One dose is recommended after age 51. Pneumococcal polysaccharide (PPSV23) vaccine. One dose is recommended after age 94. Talk to your health care provider about which screenings and vaccines you need and how often you need them. This information is not intended to replace advice given to you by your health care provider. Make sure you discuss any questions you have with your health care provider. Document Released: 11/09/2015 Document Revised: 07/02/2016 Document Reviewed: 08/14/2015 Elsevier Interactive Patient Education  2017 [place] Prevention in the Home Falls can cause injuries. They can happen to people of all ages. There are many things you can do to make your home safe and to help prevent falls. What can I do on the outside of my home? Regularly fix the edges of walkways and driveways and fix any cracks. Remove anything that might make you trip as you walk through a door, such as a raised step or threshold. Trim any bushes or trees on the path to your home. Use bright outdoor lighting. Clear any walking paths of anything that might make someone trip, such as rocks or tools. Regularly check to see if handrails are loose or broken. Make sure that both sides of any steps have handrails. Any raised decks and porches should have guardrails on the edges. Have any leaves, snow, or ice cleared regularly. Use sand or salt on walking paths during winter. Clean up any spills in your garage right away. This includes oil or grease spills. What can I do in the bathroom? Use night lights. Install grab bars by the toilet and in the tub and shower. Do not use towel bars as grab bars. Use non-skid mats or decals in the tub or shower. If you need to sit down in the shower,  use a plastic, non-slip stool. Keep the floor dry. Clean up any water that spills on the floor as soon as it happens. Remove soap buildup in the tub or shower regularly. Attach bath mats securely with double-sided non-slip rug tape. Do not have throw rugs and other things on the floor that can make you trip. What can I do in the bedroom? Use night lights. Make sure that you have a light by your bed that is easy to reach. Do not use any sheets or blankets that are too big for your bed. They should not hang down onto the floor. Have a firm chair that has side arms. You can use this for support while you get dressed. Do not have throw rugs and other things on the floor that can make you trip. What can I do in the kitchen? Clean up any spills right away. Avoid walking on wet floors. Keep items that you use a lot in easy-to-reach places. If you need to reach something above you, use a strong step stool that has a grab bar. Keep electrical cords out of the way. Do not use floor polish or wax that makes floors slippery. If you must use wax, use non-skid floor wax. Do not have throw rugs and other things on the floor that can make you trip. What can I do with my stairs? Do not leave any items on the stairs.  Make sure that there are handrails on both sides of the stairs and use them. Fix handrails that are broken or loose. Make sure that handrails are as long as the stairways. Check any carpeting to make sure that it is firmly attached to the stairs. Fix any carpet that is loose or worn. Avoid having throw rugs at the top or bottom of the stairs. If you do have throw rugs, attach them to the floor with carpet tape. Make sure that you have a light switch at the top of the stairs and the bottom of the stairs. If you do not have them, ask someone to add them for you. What else can I do to help prevent falls? Wear shoes that: Do not have high heels. Have rubber bottoms. Are comfortable and fit you  well. Are closed at the toe. Do not wear sandals. If you use a stepladder: Make sure that it is fully opened. Do not climb a closed stepladder. Make sure that both sides of the stepladder are locked into place. Ask someone to hold it for you, if possible. Clearly mark and make sure that you can see: Any grab bars or handrails. First and last steps. Where the edge of each step is. Use tools that help you move around (mobility aids) if they are needed. These include: Canes. Walkers. Scooters. Crutches. Turn on the lights when you go into a dark area. Replace any light bulbs as soon as they burn out. Set up your furniture so you have a clear path. Avoid moving your furniture around. If any of your floors are uneven, fix them. If there are any pets around you, be aware of where they are. Review your medicines with your doctor. Some medicines can make you feel dizzy. This can increase your chance of falling. Ask your doctor what other things that you can do to help prevent falls. This information is not intended to replace advice given to you by your health care provider. Make sure you discuss any questions you have with your health care provider. Document Released: 08/09/2009 Document Revised: 03/20/2016 Document Reviewed: 11/17/2014 Elsevier Interactive Patient Education  2017 [employer].

## 2021-09-09 NOTE — Progress Notes (Signed)
Subjective:   Neil Smith is a 80 y.o. male who presents for Medicare Annual/Subsequent preventive examination. Virtual Visit via Telephone Note  I connected with  Neil Smith on 09/09/21 at  4:15 PM EST by telephone and verified that I am speaking with the correct person using two identifiers.  Location: Patient: Home Provider: [facility] Persons participating in the virtual visit: patient/Nurse Health Advisor   I discussed the limitations, risks, security and privacy concerns of performing an evaluation and management service by telephone and the availability of in person appointments. The patient expressed understanding and agreed to proceed.  Interactive audio and video telecommunications were attempted between this nurse and patient, however failed, due to patient having technical difficulties OR patient did not have access to video capability.  We continued and completed visit with audio only.  Some vital signs may be absent or patient reported.   Neil Dash, LPN  Review of Systems     Cardiac Risk Factors include: advanced age (>27men, >9 women);hypertension;dyslipidemia;male gender;sedentary lifestyle;obesity (BMI >30kg/m2)     Objective:    Today's Vitals   09/09/21 1559  Weight: 227 lb (103 kg)  Height: 5\' 10"  (1.778 m)   Body mass index is 32.57 kg/m.  Advanced Directives 09/09/2021 02/01/2020 05/09/2015  Does Patient Have a Medical Advance Directive? No No Yes;No  Does patient want to make changes to medical advance directive? - - Yes - information given  Would patient like information on creating a medical advance directive? No - Patient declined No - Patient declined -    Current Medications (verified) Outpatient Encounter Medications as of 09/09/2021  Medication Sig   albuterol (VENTOLIN HFA) 108 (90 Base) MCG/ACT inhaler TAKE 2 PUFFS BY MOUTH EVERY 6 HOURS AS NEEDED FOR WHEEZE OR SHORTNESS OF BREATH   atorvastatin (LIPITOR) 20 MG tablet Take  1 tablet (20 mg total) by mouth every evening.   fluticasone-salmeterol (ADVAIR) 250-50 MCG/ACT AEPB Inhale 1 puff into the lungs in the morning and at bedtime.   furosemide (LASIX) 20 MG tablet Take 1 tablet (20 mg total) by mouth daily.   gabapentin (NEURONTIN) 300 MG capsule Take 1 capsule (300 mg total) by mouth at bedtime.   lisinopril (ZESTRIL) 40 MG tablet Take 1 tablet (40 mg total) by mouth daily.   SHINGRIX injection    No facility-administered encounter medications on file as of 09/09/2021.    Allergies (verified) Patient has no known allergies.   History: Past Medical History:  Diagnosis Date   COPD (chronic obstructive pulmonary disease) (HCC)    Hyperlipidemia    Hypertension    No past surgical history on file. Family History  Problem Relation Age of Onset   Dementia Mother 58   Heart attack Brother    Hyperlipidemia Son    Heart attack Brother    Alcohol abuse Brother    Cancer Brother 48       Lung   Hyperlipidemia Son    Social History   Socioeconomic History   Marital status: Widowed    Spouse name: Not on file   Number of children: 2   Years of education: 7   Highest education level: 8th grade  Occupational History   Occupation: Retired    Comment: [employer]  Tobacco Use   Smoking status: Former    Types: Cigarettes    Quit date: 10/14/2002    Years since quitting: 18.9   Smokeless tobacco: Never  Vaping Use   Vaping Use: Never  used  Substance and Sexual Activity   Alcohol use: No   Drug use: No   Sexual activity: Not Currently  Other Topics Concern   Not on file  Social History Narrative   2 sons   Social Determinants of Health   Financial Resource Strain: Low Risk    Difficulty of Paying Living Expenses: Not hard at all  Food Insecurity: No Food Insecurity   Worried About Programme researcher, broadcasting/film/video in the Last Year: Never true   Ran Out of Food in the Last Year: Never true  Transportation Needs: No Transportation Needs   Lack of  Transportation (Medical): No   Lack of Transportation (Non-Medical): No  Physical Activity: Insufficiently Active   Days of Exercise per Week: 3 days   Minutes of Exercise per Session: 20 min  Stress: No Stress Concern Present   Feeling of Stress : Not at all  Social Connections: Socially Isolated   Frequency of Communication with Friends and Family: More than three times a week   Frequency of Social Gatherings with Friends and Family: More than three times a week   Attends Religious Services: Never   Database administrator or Organizations: No   Attends Banker Meetings: Never   Marital Status: Widowed    Tobacco Counseling Counseling given: Not Answered   Clinical Intake:  Pre-visit preparation completed: Yes  Pain : No/denies pain     BMI - recorded: 32.57 Nutritional Status: BMI > 30  Obese Nutritional Risks: None Diabetes: No  How often do you need to have someone help you when you read instructions, pamphlets, or other written materials from your doctor or pharmacy?: 1 - Never  Diabetic?no  Interpreter Needed?: No      Activities of Daily Living In your present state of health, do you have any difficulty performing the following activities: 09/09/2021  Hearing? N  Vision? N  Difficulty concentrating or making decisions? N  Walking or climbing stairs? N  Dressing or bathing? N  Doing errands, shopping? N  Preparing Food and eating ? N  Using the Toilet? N  In the past six months, have you accidently leaked urine? N  Do you have problems with loss of bowel control? N  Managing your Medications? N  Managing your Finances? N  Housekeeping or managing your Housekeeping? N  Some recent data might be hidden    Patient Care Team: Neil Pierini, FNP as PCP - General (Nurse Practitioner)  Indicate any recent Medical Services you may have received from other than [facility] providers in the past year (date may be approximate).      Assessment:   This is a routine wellness examination for Neil Smith.  Hearing/Vision screen Hearing Screening - Comments:: No hearing issues.  Vision Screening - Comments:: Would like to wait on eye exam.   Dietary issues and exercise activities discussed: Current Exercise Habits: Home exercise routine, Type of exercise: walking, Time (Minutes): 20, Frequency (Times/Week): 3, Weekly Exercise (Minutes/Week): 60, Intensity: Mild, Exercise limited by: cardiac condition(s);respiratory conditions(s)   Goals Addressed             This Visit's Progress    Exercise 3x per week (30 min per time)   On track    Have 3 meals a day   On track      Depression Screen PHQ 2/9 Scores 09/09/2021 07/02/2021 09/18/2020 02/03/2020 02/01/2020 07/18/2019 04/07/2019  PHQ - 2 Score 0 0 0 0 0 0 0  PHQ- 9 Score -  0 - - - - -    Fall Risk Fall Risk  09/09/2021 07/02/2021 09/18/2020 02/03/2020 02/01/2020  Falls in the past year? 0 0 0 0 0  Number falls in past yr: 0 - - - -  Injury with Fall? 0 - - - -  Risk for fall due to : Impaired balance/gait;Impaired mobility - - - -  Follow up Falls prevention discussed - - - -    FALL RISK PREVENTION PERTAINING TO THE HOME:  Any stairs in or around the home? Yes  If so, are there any without handrails? No  Home free of loose throw rugs in walkways, pet beds, electrical cords, etc? Yes  Adequate lighting in your home to reduce risk of falls? Yes   ASSISTIVE DEVICES UTILIZED TO PREVENT FALLS:  Life alert? No  Use of a cane, walker or w/c? No  Grab bars in the bathroom? Yes  Shower chair or bench in shower? Yes  Elevated toilet seat or a handicapped toilet? No   TIMED UP AND GO:  Was the test performed? No . Phone visit.   Cognitive Function: Normal cognitive status assessed by direct observation by this Nurse Health Advisor. No abnormalities found.  Pt declined 6CIT MMSE - Mini Mental State Exam 10/14/2017  Not completed: Unable to complete     6CIT Screen  02/01/2020  What Year? 4 points  What month? 0 points  What time? 0 points  Count back from 20 0 points  Months in reverse 0 points  Repeat phrase 0 points  Total Score 4    Immunizations Immunization History  Administered Date(s) Administered   Fluad Quad(high Dose 65+) 07/25/2019, 09/18/2020   Influenza, High Dose Seasonal PF 07/03/2014, 07/28/2016, 08/25/2017, 09/29/2018   Influenza,inj,Quad PF,6+ Mos 08/15/2013, 07/30/2015   Pneumococcal Conjugate-13 05/09/2015   Pneumococcal Polysaccharide-23 10/14/2017    TDAP status: Due, Education has been provided regarding the importance of this vaccine. Advised may receive this vaccine at local pharmacy or Health Dept. Aware to provide a copy of the vaccination record if obtained from local pharmacy or Health Dept. Verbalized acceptance and understanding.  Flu Vaccine status: Due, Education has been provided regarding the importance of this vaccine. Advised may receive this vaccine at local pharmacy or Health Dept. Aware to provide a copy of the vaccination record if obtained from local pharmacy or Health Dept. Verbalized acceptance and understanding.  Pneumococcal vaccine status: Up to date  Covid-19 vaccine status: Information provided on how to obtain vaccines.   Qualifies for Shingles Vaccine? Yes   Zostavax completed No   Shingrix Completed?: No.    Education has been provided regarding the importance of this vaccine. Patient has been advised to call insurance company to determine out of pocket expense if they have not yet received this vaccine. Advised may also receive vaccine at local pharmacy or Health Dept. Verbalized acceptance and understanding.  Screening Tests Health Maintenance  Topic Date Due   COVID-19 Vaccine (1) Never done   TETANUS/TDAP  09/18/2021 (Originally 02/14/1960)   Zoster Vaccines- Shingrix (1 of 2) 10/01/2021 (Originally 02/14/1991)   INFLUENZA VACCINE  01/24/2022 (Originally 05/27/2021)   Pneumonia Vaccine 3+  Years old  Completed   HPV VACCINES  Aged Out    Health Maintenance  Health Maintenance Due  Topic Date Due   COVID-19 Vaccine (1) Never done    Colorectal cancer screening: No longer required.   Lung Cancer Screening: (Low Dose CT Chest recommended if Age 64-80 years, 30 pack-year  currently smoking OR have quit w/in 15years.) does not qualify.  Quit 19 years ago.  Additional Screening:  Hepatitis C Screening: does not qualify;   Vision Screening: Recommended annual ophthalmology exams for early detection of glaucoma and other disorders of the eye. Is the patient up to date with their annual eye exam?  No  Who is the provider or what is the name of the office in which the patient attends annual eye exams? N/A If pt is not established with a provider, would they like to be referred to a provider to establish care? No .   Dental Screening: Recommended annual dental exams for proper oral hygiene  Community Resource Referral / Chronic Care Management: CRR required this visit?  No   CCM required this visit?  No      Plan:     I have personally reviewed and noted the following in the patient's chart:   Medical and social history Use of alcohol, tobacco or illicit drugs  Current medications and supplements including opioid prescriptions. Patient is not currently taking opioid prescriptions. Functional ability and status Nutritional status Physical activity Advanced directives List of other physicians Hospitalizations, surgeries, and ER visits in previous 12 months Vitals Screenings to include cognitive, depression, and falls Referrals and appointments  In addition, I have reviewed and discussed with patient certain preventive protocols, quality metrics, and best practice recommendations. A written personalized care plan for preventive services as well as general preventive health recommendations were provided to patient.     Neil Dash, LPN   [id number]    Nurse Notes: Colonosocpy no longer required due to age. Discussed eye exam and pt states his daughter in law Tammy, will make him an eye appointment. Discussed flu, covid and shingles vaccines and how to obtain them.

## 2021-10-02 DIAGNOSIS — J449 Chronic obstructive pulmonary disease, unspecified: Secondary | ICD-10-CM | POA: Diagnosis not present

## 2021-11-02 DIAGNOSIS — J449 Chronic obstructive pulmonary disease, unspecified: Secondary | ICD-10-CM | POA: Diagnosis not present

## 2021-12-03 DIAGNOSIS — J449 Chronic obstructive pulmonary disease, unspecified: Secondary | ICD-10-CM | POA: Diagnosis not present

## 2021-12-31 ENCOUNTER — Ambulatory Visit (INDEPENDENT_AMBULATORY_CARE_PROVIDER_SITE_OTHER): Payer: Medicare HMO | Admitting: Nurse Practitioner

## 2021-12-31 ENCOUNTER — Encounter: Payer: Self-pay | Admitting: Nurse Practitioner

## 2021-12-31 VITALS — BP 138/64 | HR 72 | Temp 97.3°F | Resp 20 | Ht 70.0 in | Wt 223.0 lb

## 2021-12-31 DIAGNOSIS — E785 Hyperlipidemia, unspecified: Secondary | ICD-10-CM | POA: Diagnosis not present

## 2021-12-31 DIAGNOSIS — Z6834 Body mass index (BMI) 34.0-34.9, adult: Secondary | ICD-10-CM

## 2021-12-31 DIAGNOSIS — J410 Simple chronic bronchitis: Secondary | ICD-10-CM

## 2021-12-31 DIAGNOSIS — I10 Essential (primary) hypertension: Secondary | ICD-10-CM | POA: Diagnosis not present

## 2021-12-31 DIAGNOSIS — G629 Polyneuropathy, unspecified: Secondary | ICD-10-CM | POA: Diagnosis not present

## 2021-12-31 DIAGNOSIS — J449 Chronic obstructive pulmonary disease, unspecified: Secondary | ICD-10-CM | POA: Diagnosis not present

## 2021-12-31 DIAGNOSIS — R609 Edema, unspecified: Secondary | ICD-10-CM

## 2021-12-31 LAB — CBC WITH DIFFERENTIAL/PLATELET
Basophils Absolute: 0 10*3/uL (ref 0.0–0.2)
Basos: 1 %
EOS (ABSOLUTE): 0.1 10*3/uL (ref 0.0–0.4)
Eos: 2 %
Hematocrit: 41.7 % (ref 37.5–51.0)
Hemoglobin: 13.9 g/dL (ref 13.0–17.7)
Immature Grans (Abs): 0 10*3/uL (ref 0.0–0.1)
Immature Granulocytes: 0 %
Lymphocytes Absolute: 1.4 10*3/uL (ref 0.7–3.1)
Lymphs: 21 %
MCH: 30 pg (ref 26.6–33.0)
MCHC: 33.3 g/dL (ref 31.5–35.7)
MCV: 90 fL (ref 79–97)
Monocytes Absolute: 0.4 10*3/uL (ref 0.1–0.9)
Monocytes: 6 %
Neutrophils Absolute: 4.6 10*3/uL (ref 1.4–7.0)
Neutrophils: 70 %
Platelets: 257 10*3/uL (ref 150–450)
RBC: 4.63 x10E6/uL (ref 4.14–5.80)
RDW: 12.6 % (ref 11.6–15.4)
WBC: 6.5 10*3/uL (ref 3.4–10.8)

## 2021-12-31 LAB — CMP14+EGFR
ALT: 30 IU/L (ref 0–44)
AST: 22 IU/L (ref 0–40)
Albumin/Globulin Ratio: 2.1 (ref 1.2–2.2)
Albumin: 4.7 g/dL (ref 3.7–4.7)
Alkaline Phosphatase: 71 IU/L (ref 44–121)
BUN/Creatinine Ratio: 12 (ref 10–24)
BUN: 15 mg/dL (ref 8–27)
Bilirubin Total: 0.6 mg/dL (ref 0.0–1.2)
CO2: 24 mmol/L (ref 20–29)
Calcium: 9.8 mg/dL (ref 8.6–10.2)
Chloride: 102 mmol/L (ref 96–106)
Creatinine, Ser: 1.25 mg/dL (ref 0.76–1.27)
Globulin, Total: 2.2 g/dL (ref 1.5–4.5)
Glucose: 105 mg/dL — ABNORMAL HIGH (ref 70–99)
Potassium: 5 mmol/L (ref 3.5–5.2)
Sodium: 139 mmol/L (ref 134–144)
Total Protein: 6.9 g/dL (ref 6.0–8.5)
eGFR: 58 mL/min/{1.73_m2} — ABNORMAL LOW (ref >59)

## 2021-12-31 LAB — LIPID PANEL
Chol/HDL Ratio: 3.7 ratio (ref 0.0–5.0)
Cholesterol, Total: 128 mg/dL (ref 100–199)
HDL: 35 mg/dL — ABNORMAL LOW (ref >39)
LDL Chol Calc (NIH): 71 mg/dL (ref 0–99)
Triglycerides: 122 mg/dL (ref 0–149)
VLDL Cholesterol Cal: 22 mg/dL (ref 5–40)

## 2021-12-31 MED ORDER — LISINOPRIL 40 MG PO TABS: 40.0000 mg | ORAL_TABLET | Freq: Every day | ORAL | 1 refills | Status: DC

## 2021-12-31 MED ORDER — FUROSEMIDE 20 MG PO TABS: 20.0000 mg | ORAL_TABLET | Freq: Every day | ORAL | 1 refills | Status: DC

## 2021-12-31 MED ORDER — GABAPENTIN 300 MG PO CAPS: 300.0000 mg | ORAL_CAPSULE | Freq: Every day | ORAL | 1 refills | Status: DC

## 2021-12-31 MED ORDER — FLUTICASONE-SALMETEROL 250-50 MCG/ACT IN AEPB: 1.0000 | INHALATION_SPRAY | Freq: Two times a day (BID) | RESPIRATORY_TRACT | 5 refills | Status: DC

## 2021-12-31 MED ORDER — ATORVASTATIN CALCIUM 20 MG PO TABS: 20.0000 mg | ORAL_TABLET | Freq: Every evening | ORAL | 1 refills | Status: DC

## 2021-12-31 NOTE — Progress Notes (Signed)
? ?Subjective:  ? ? Patient ID: Neil Smith, male    DOB: Jan 17, 1941, 81 y.o.   MRN: [id number] ? ?Chief Complaint: medical management of chronic issues  ?  ? ?HPI: ? ?Neil Smith is a 81 y.o. who identifies as a male who was assigned male at birth.  ? ?Social history: ?Lives with: lives by hisself- family checks o him daily ?Work history: retired from [employer] ? ? ?Comes in today for follow up of the following chronic medical issues: ? ?1. Primary hypertension ?No c/o chest pain, sob or headache. Doe snot check bloo dpressure at home. ?BP Readings from Last 3 Encounters:  ?07/02/21 135/65  ?09/18/20 (!) 142/62  ?02/03/20 (!) 146/71  ? ? ? ?2. Hyperlipidemia with target LDL less than 100 ?Does not watch diet and does very little exercise. ?Lab Results  ?Component Value Date  ? CHOL 120 07/02/2021  ? HDL 39 (L) 07/02/2021  ? LDLCALC 59 07/02/2021  ? TRIG 123 07/02/2021  ? CHOLHDL 3.1 07/02/2021  ? ? ? ?3. Simple chronic bronchitis ([place]) ?Is on advair daily and very seldom uses albuterol. He does wear his oxygen all the time. He doe snot see a pulmonologist. ? ?4. Neuropathy ?Is on neuronton for burning in bil feet. Is doing well. No medication side effects ? ?5. Peripheral edema ?Is on lasix daily which works well to keep swelling down ? ?6. BMI 34.0-34.9,adult ?Weight is down 4 lbs ?Wt Readings from Last 3 Encounters:  ?12/31/21 223 lb (101.2 kg)  ?09/09/21 227 lb (103 kg)  ?07/02/21 227 lb (103 kg)  ? ?BMI Readings from Last 3 Encounters:  ?12/31/21 32.00 kg/m?  ?09/09/21 32.57 kg/m?  ?07/02/21 32.57 kg/m?  ? ? ? ? ? ?New complaints: ?None today ? ?No Known Allergies ?Outpatient Encounter Medications as of 12/31/2021  ?Medication Sig  ? albuterol (VENTOLIN HFA) 108 (90 Base) MCG/ACT inhaler TAKE 2 PUFFS BY MOUTH EVERY 6 HOURS AS NEEDED FOR WHEEZE OR SHORTNESS OF BREATH  ? atorvastatin (LIPITOR) 20 MG tablet Take 1 tablet (20 mg total) by mouth every evening.  ? fluticasone-salmeterol (ADVAIR) 250-50 MCG/ACT  AEPB Inhale 1 puff into the lungs in the morning and at bedtime.  ? furosemide (LASIX) 20 MG tablet Take 1 tablet (20 mg total) by mouth daily.  ? gabapentin (NEURONTIN) 300 MG capsule Take 1 capsule (300 mg total) by mouth at bedtime.  ? lisinopril (ZESTRIL) 40 MG tablet Take 1 tablet (40 mg total) by mouth daily.  ? SHINGRIX injection   ? ?No facility-administered encounter medications on file as of 12/31/2021.  ? ? ?History reviewed. No pertinent surgical history. ? ?Family History  ?Problem Relation Age of Onset  ? Dementia Mother 62  ? Heart attack Brother   ? Hyperlipidemia Son   ? Heart attack Brother   ? Alcohol abuse Brother   ? Cancer Brother 2  ?     Lung  ? Hyperlipidemia Son   ? ? ? ? ?Controlled substance contract: n/a ? ? ? ? ? ?Review of Systems  ?Constitutional:  Negative for diaphoresis.  ?Eyes:  Negative for pain.  ?Respiratory:  Negative for shortness of breath.   ?Cardiovascular:  Negative for chest pain, palpitations and leg swelling.  ?Gastrointestinal:  Negative for abdominal pain.  ?Endocrine: Negative for polydipsia.  ?Skin:  Negative for rash.  ?Neurological:  Negative for dizziness, weakness and headaches.  ?Hematological:  Does not bruise/bleed easily.  ?All other systems reviewed and are negative. ? ?   ?  Objective:  ? Physical Exam ?Vitals and nursing note reviewed.  ?Constitutional:   ?   Appearance: Normal appearance. He is well-developed.  ?HENT:  ?   Head: Normocephalic.  ?   Nose: Nose normal.  ?   Mouth/Throat:  ?   Mouth: Mucous membranes are moist.  ?   Pharynx: Oropharynx is clear.  ?Eyes:  ?   Pupils: Pupils are equal, round, and reactive to light.  ?Neck:  ?   Thyroid: No thyroid mass or thyromegaly.  ?   Vascular: No carotid bruit or JVD.  ?   Trachea: Phonation normal.  ?Cardiovascular:  ?   Rate and Rhythm: Normal rate and regular rhythm.  ?Pulmonary:  ?   Effort: Pulmonary effort is normal. No respiratory distress.  ?   Breath sounds: Normal breath sounds.  ?Abdominal:  ?    General: Bowel sounds are normal.  ?   Palpations: Abdomen is soft.  ?   Tenderness: There is no abdominal tenderness.  ?Musculoskeletal:     ?   General: Normal range of motion.  ?   Cervical back: Normal range of motion and neck supple.  ?Lymphadenopathy:  ?   Cervical: No cervical adenopathy.  ?Skin: ?   General: Skin is warm and dry.  ?Neurological:  ?   Mental Status: He is alert and oriented to person, place, and time.  ?Psychiatric:     ?   Behavior: Behavior normal.     ?   Thought Content: Thought content normal.     ?   Judgment: Judgment normal.  ? ? ?BP 138/64   Pulse 72   Temp (!) 97.3 ?F (36.3 ?C) (Temporal)   Resp 20   Ht _0  (1.778 m)   Wt 223 lb (101.2 kg)   SpO2 96% Comment: 3 liters O2  BMI 32.00 kg/m?  ? ? ? ? ?   ?Assessment & Plan:  ?Neil Smith comes in today with chief complaint of Medical Management of Chronic Issues ? ? ?Diagnosis and orders addressed: ? ?1. Primary hypertension ?Low sodium diet ?- lisinopril (ZESTRIL) 40 MG tablet; Take 1 tablet (40 mg total) by mouth daily.  Dispense: 90 tablet; Refill: 1 ?- CBC with Differential/Platelet ?- CMP14+EGFR ? ?2. Hyperlipidemia with target LDL less than 100 ?Low fat diet ?- atorvastatin (LIPITOR) 20 MG tablet; Take 1 tablet (20 mg total) by mouth every evening.  Dispense: 90 tablet; Refill: 1 ?- Lipid panel ? ?3. Simple chronic bronchitis ([place]) ?Continue oxygen daily ?- fluticasone-salmeterol (ADVAIR) 250-50 MCG/ACT AEPB; Inhale 1 puff into the lungs in the morning and at bedtime.  Dispense: 60 each; Refill: 5 ? ?4. Neuropathy ?Check feet daily ?- gabapentin (NEURONTIN) 300 MG capsule; Take 1 capsule (300 mg total) by mouth at bedtime.  Dispense: 90 capsule; Refill: 1 ? ?5. Peripheral edema ?Elevate legs when sitting ?- furosemide (LASIX) 20 MG tablet; Take 1 tablet (20 mg total) by mouth daily.  Dispense: 90 tablet; Refill: 1 ? ?6. BMI 34.0-34.9,adult ?Discussed diet and exercise for person with BMI >25 ?Will recheck weight  in 3-6 months ? ? ? ?Labs pending ?Health Maintenance reviewed ?Diet and exercise encouraged ? ?Follow up plan: ?6 months ? ? ?Mary-Margaret Hassell Done, FNP ? ? ?

## 2021-12-31 NOTE — Patient Instructions (Signed)
Neuropathic Pain °Neuropathic pain is pain caused by damage to the nerves that are responsible for certain sensations in your body (sensory nerves). °Neuropathic pain can make you more sensitive to pain. Even a minor sensation can feel very painful. This is usually a long-term (chronic) condition that can be difficult to treat. The type of pain differs from person to person. It may: °Start suddenly (acute), or it may develop slowly and become chronic. °Come and go as damaged nerves heal, or it may stay at the same level for years. °Cause emotional distress, loss of sleep, and a lower quality of life. °What are the causes? °The most common cause of this condition is diabetes. Many other diseases and conditions can also cause neuropathic pain. Causes of neuropathic pain can be classified as: °Toxic. This is caused by medicines and chemicals. The most common causes of toxic neuropathic pain is damage from medicines that kill cancer cells (chemotherapy) or alcohol abuse. °Metabolic. This can be caused by: °Diabetes. °Lack of vitamins like B12. °Traumatic. Any injury that cuts, crushes, or stretches a nerve can cause damage and pain. °Compression-related. If a sensory nerve gets trapped or compressed for a long period of time, the blood supply to the nerve can be cut off. °Vascular. Many blood vessel diseases can cause neuropathic pain by decreasing blood supply and oxygen to nerves. °Autoimmune. This type of pain results from diseases in which the body's defense system (immune system) mistakenly attacks sensory nerves. Examples of autoimmune diseases that can cause neuropathic pain include lupus and multiple sclerosis. °Infectious. Many types of viral infections can damage sensory nerves and cause pain. Shingles infection is a common cause of this type of pain. °Inherited. Neuropathic pain can be a symptom of many diseases that are passed down through families (genetic). °What increases the risk? °You are more likely to  develop this condition if: °You have diabetes. °You smoke. °You drink too much alcohol. °You are taking certain medicines, including chemotherapy or medicines that treat immune system disorders. °What are the signs or symptoms? °The main symptom is pain. Neuropathic pain is often described as: °Burning. °Shock-like. °Stinging. °Hot or cold. °Itching. °How is this diagnosed? °No single test can diagnose neuropathic pain. It is diagnosed based on: °A physical exam and your symptoms. Your health care provider will ask you about your pain. You may be asked to use a pain scale to describe how bad your pain is. °Tests. These may be done to see if you have a cause and location of any nerve damage. They include: °Nerve conduction studies and electromyography to test how well nerve signals travel through your nerves and muscles (electrodiagnostic testing). °Skin biopsy to evaluate for small fiber neuropathy. °Imaging studies, such as: °X-rays. °CT scan. °MRI. °How is this treated? °Treatment for neuropathic pain may change over time. You may need to try different treatment options or a combination of treatments. Some options include: °Treating the underlying cause of the neuropathy, such as diabetes, kidney disease, or vitamin deficiencies. °Stopping medicines that can cause neuropathy, such as chemotherapy. °Medicine to relieve pain. Medicines may include: °Prescription or over-the-counter pain medicine. °Anti-seizure medicine. °Antidepressant medicines. °Pain-relieving patches or creams that are applied to painful areas of skin. °A medicine to numb the area (local anesthetic), which can be injected as a nerve block. °Transcutaneous nerve stimulation. This uses electrical currents to block painful nerve signals. The treatment is painless. °Alternative treatments, such as: °Acupuncture. °Meditation. °Massage. °Occupational or physical therapy. °Pain management programs. °Counseling. °Follow   these instructions at  home: °Medicines ° °Take over-the-counter and prescription medicines only as told by your health care provider. °Ask your health care provider if the medicine prescribed to you: °Requires you to avoid driving or using machinery. °Can cause constipation. You may need to take these actions to prevent or treat constipation: °Drink enough fluid to keep your urine pale yellow. °Take over-the-counter or prescription medicines. °Eat foods that are high in fiber, such as beans, whole grains, and fresh fruits and vegetables. °Limit foods that are high in fat and processed sugars, such as fried or sweet foods. °Lifestyle ° °Have a good support system at home. °Consider joining a chronic pain support group. °Do not use any products that contain nicotine or tobacco. These products include cigarettes, chewing tobacco, and vaping devices, such as e-cigarettes. If you need help quitting, ask your health care provider. °Do not drink alcohol. °General instructions °Learn as much as you can about your condition. °Work closely with all your health care providers to find the treatment plan that works best for you. °Ask your health care provider what activities are safe for you. °Keep all follow-up visits. This is important. °Contact a health care provider if: °Your pain treatments are not working. °You are having side effects from your medicines. °You are struggling with tiredness (fatigue), mood changes, depression, or anxiety. °Get help right away if: °You have thoughts of hurting yourself. °Get help right away if you feel like you may hurt yourself or others, or have thoughts about taking your own life. Go to your nearest emergency room or: °Call 911. °Call the National Suicide Prevention Lifeline at 1-800-273-8255 or 988. This is open 24 hours a day. °Text the Crisis Text Line at 741741. °Summary °Neuropathic pain is pain caused by damage to the nerves that are responsible for certain sensations in your body (sensory  nerves). °Neuropathic pain may come and go as damaged nerves heal, or it may stay at the same level for years. °Neuropathic pain is usually a long-term condition that can be difficult to treat. Consider joining a chronic pain support group. °This information is not intended to replace advice given to you by your health care provider. Make sure you discuss any questions you have with your health care provider. °Document Revised: 06/10/2021 Document Reviewed: 06/10/2021 °Elsevier Patient Education © 2022 Elsevier Inc. ° °

## 2021-12-31 NOTE — Addendum Note (Signed)
Addended by: Cleda Daub on: 12/31/2021 09:08 AM ? ? Modules accepted: Orders ? ?

## 2022-01-27 ENCOUNTER — Ambulatory Visit (INDEPENDENT_AMBULATORY_CARE_PROVIDER_SITE_OTHER): Payer: Medicare HMO | Admitting: Nurse Practitioner

## 2022-01-27 ENCOUNTER — Encounter: Payer: Self-pay | Admitting: Nurse Practitioner

## 2022-01-27 VITALS — BP 154/61 | HR 96 | Temp 98.3°F | Resp 22 | Ht 70.0 in | Wt 219.0 lb

## 2022-01-27 DIAGNOSIS — J00 Acute nasopharyngitis [common cold]: Secondary | ICD-10-CM

## 2022-01-27 MED ORDER — DOXYCYCLINE HYCLATE 100 MG PO TABS: 100.0000 mg | ORAL_TABLET | Freq: Two times a day (BID) | ORAL | 0 refills | Status: DC

## 2022-01-27 MED ORDER — FLUTICASONE PROPIONATE 50 MCG/ACT NA SUSP
2.0000 | Freq: Every day | NASAL | 6 refills | Status: AC
Start: 2022-01-27 — End: ?

## 2022-01-27 NOTE — Progress Notes (Signed)
? ?Subjective:  ? ? Patient ID: Neil Smith, male    DOB: 04/06/1941, 81 y.o.   MRN: [id number] ? ? ?Chief Complaint: uri ? ?URI  ?This is a new problem. The current episode started 1 to 4 weeks ago (at least 9 days). The problem has been waxing and waning. There has been no fever. Associated symptoms include congestion, coughing, headaches and rhinorrhea. Pertinent negatives include no sinus pain or sore throat. He has tried acetaminophen for the symptoms. The treatment provided mild relief.  Wears oxygen 24/7 with anulla, having trouble breathing thorugh his nose so does not feel like he is getting the oxygen he needs. ? ? ? ? ?Review of Systems  ?Constitutional:  Positive for fatigue. Negative for chills and fever.  ?HENT:  Positive for congestion and rhinorrhea. Negative for sinus pain and sore throat.   ?Respiratory:  Positive for cough.   ?Neurological:  Positive for headaches.  ? ?   ?Objective:  ? Physical Exam ?Vitals and nursing note reviewed.  ?Constitutional:   ?   Appearance: Normal appearance. He is well-developed.  ?HENT:  ?   Head: Normocephalic.  ?   Right Ear: Tympanic membrane normal.  ?   Left Ear: Tympanic membrane normal.  ?   Nose: Congestion and rhinorrhea present.  ?   Mouth/Throat:  ?   Mouth: Mucous membranes are moist.  ?   Pharynx: Oropharynx is clear.  ?Eyes:  ?   Pupils: Pupils are equal, round, and reactive to light.  ?Neck:  ?   Thyroid: No thyroid mass or thyromegaly.  ?   Vascular: No carotid bruit or JVD.  ?   Trachea: Phonation normal.  ?Cardiovascular:  ?   Rate and Rhythm: Normal rate and regular rhythm.  ?Pulmonary:  ?   Effort: Pulmonary effort is normal. No respiratory distress.  ?   Breath sounds: Normal breath sounds.  ?Abdominal:  ?   General: Bowel sounds are normal.  ?   Palpations: Abdomen is soft.  ?   Tenderness: There is no abdominal tenderness.  ?Musculoskeletal:     ?   General: Normal range of motion.  ?   Cervical back: Normal range of motion and neck  supple.  ?Lymphadenopathy:  ?   Cervical: No cervical adenopathy.  ?Skin: ?   General: Skin is warm and dry.  ?Neurological:  ?   Mental Status: He is alert and oriented to person, place, and time.  ?Psychiatric:     ?   Behavior: Behavior normal.     ?   Thought Content: Thought content normal.     ?   Judgment: Judgment normal.  ? ? ? ? ?BP (!) 154/61   Pulse 96   Temp 98.3 ?F (36.8 ?C)   Resp (!) 22   Ht 5\' 10"  (1.778 m)   Wt 219 lb (99.3 kg)   SpO2 92% Comment: on 3 liters  BMI 31.42 kg/m? ' ?   ?Assessment & Plan:  ?ARMANII PRESSNELL in today with chief complaint of URI ? ? ?1. Acute nasopharyngitis ?Run humidifier ?Keep check of oxygen saturation ?Breath through nose to get oxygen through nose ?- fluticasone (FLONASE) 50 MCG/ACT nasal spray; Place 2 sprays into both nostrils daily.  Dispense: 16 g; Refill: 6 ?- doxycycline (VIBRA-TABS) 100 MG tablet; Take 1 tablet (100 mg total) by mouth 2 (two) times daily. 1 po bid  Dispense: 20 tablet; Refill: 0 ? ? ? ?The above assessment and management plan  was discussed with the patient. The patient verbalized understanding of and has agreed to the management plan. Patient is aware to call the clinic if symptoms persist or worsen. Patient is aware when to return to the clinic for a follow-up visit. Patient educated on when it is appropriate to go to the emergency department.  ? ?Mary-Margaret Daphine Deutscher, FNP ? ? ? ?

## 2022-01-31 DIAGNOSIS — J449 Chronic obstructive pulmonary disease, unspecified: Secondary | ICD-10-CM | POA: Diagnosis not present

## 2022-03-02 DIAGNOSIS — J449 Chronic obstructive pulmonary disease, unspecified: Secondary | ICD-10-CM | POA: Diagnosis not present

## 2022-04-02 DIAGNOSIS — J449 Chronic obstructive pulmonary disease, unspecified: Secondary | ICD-10-CM | POA: Diagnosis not present

## 2022-05-02 DIAGNOSIS — J449 Chronic obstructive pulmonary disease, unspecified: Secondary | ICD-10-CM | POA: Diagnosis not present

## 2022-07-07 ENCOUNTER — Encounter: Payer: Self-pay | Admitting: Nurse Practitioner

## 2022-07-07 ENCOUNTER — Ambulatory Visit (INDEPENDENT_AMBULATORY_CARE_PROVIDER_SITE_OTHER): Payer: Medicare HMO | Admitting: Nurse Practitioner

## 2022-07-07 VITALS — BP 126/55 | HR 52 | Temp 97.4°F | Resp 20 | Ht 70.0 in | Wt 218.0 lb

## 2022-07-07 DIAGNOSIS — Z6834 Body mass index (BMI) 34.0-34.9, adult: Secondary | ICD-10-CM | POA: Diagnosis not present

## 2022-07-07 DIAGNOSIS — J410 Simple chronic bronchitis: Secondary | ICD-10-CM | POA: Diagnosis not present

## 2022-07-07 DIAGNOSIS — G629 Polyneuropathy, unspecified: Secondary | ICD-10-CM

## 2022-07-07 DIAGNOSIS — E785 Hyperlipidemia, unspecified: Secondary | ICD-10-CM | POA: Diagnosis not present

## 2022-07-07 DIAGNOSIS — R609 Edema, unspecified: Secondary | ICD-10-CM | POA: Diagnosis not present

## 2022-07-07 DIAGNOSIS — I10 Essential (primary) hypertension: Secondary | ICD-10-CM

## 2022-07-07 MED ORDER — LISINOPRIL 40 MG PO TABS: 40.0000 mg | ORAL_TABLET | Freq: Every day | ORAL | 1 refills | Status: DC

## 2022-07-07 MED ORDER — FUROSEMIDE 20 MG PO TABS: 20.0000 mg | ORAL_TABLET | Freq: Every day | ORAL | 1 refills | Status: DC

## 2022-07-07 MED ORDER — FLUTICASONE-SALMETEROL 250-50 MCG/ACT IN AEPB: 1.0000 | INHALATION_SPRAY | Freq: Two times a day (BID) | RESPIRATORY_TRACT | 5 refills | Status: DC

## 2022-07-07 MED ORDER — ATORVASTATIN CALCIUM 20 MG PO TABS: 20.0000 mg | ORAL_TABLET | Freq: Every evening | ORAL | 1 refills | Status: DC

## 2022-07-07 MED ORDER — GABAPENTIN 300 MG PO CAPS: 300.0000 mg | ORAL_CAPSULE | Freq: Every day | ORAL | 1 refills | Status: DC

## 2022-07-07 NOTE — Progress Notes (Addendum)
Subjective:    Patient ID: Neil Smith, male    DOB: 10-Oct-1941, 81 y.o.   MRN: [id number]   Chief Complaint: medical management of chronic issues     HPI:  Neil Smith is a 81 y.o. who identifies as a male who was assigned male at birth.   Social history: Lives with: by hisself- son checks on him daily Work history: retired Engineering geologist   Comes in today for follow up of the following chronic medical issues:  1. Primary hypertension No c/o chest pain, sob or headache. Does not check blood pressure at home. BP Readings from Last 3 Encounters:  07/07/22 (!) 126/55  01/27/22 (!) 154/61  12/31/21 138/64      2. Hyperlipidemia with target LDL less than 100 Does try to watch diet but does no dedicated exercise. Lab Results  Component Value Date   CHOL 128 12/31/2021   HDL 35 (L) 12/31/2021   LDLCALC 71 12/31/2021   TRIG 122 12/31/2021   CHOLHDL 3.7 12/31/2021     3. Simple chronic bronchitis ([place]) Is on advair daily and is doing well. Very seldom needs albuterol.  4. Neuropathy Burning of bil feet . Tolerable  5. Peripheral edema Has daily  6. BMI 34.0-34.9,adult No recent weight changes Wt Readings from Last 3 Encounters:  07/07/22 218 lb (98.9 kg)  01/27/22 219 lb (99.3 kg)  12/31/21 223 lb (101.2 kg)   BMI Readings from Last 3 Encounters:  07/07/22 31.28 kg/m  01/27/22 31.42 kg/m  12/31/21 32.00 kg/m     New complaints: None today   No Known Allergies Outpatient Encounter Medications as of 07/07/2022  Medication Sig   albuterol (VENTOLIN HFA) 108 (90 Base) MCG/ACT inhaler TAKE 2 PUFFS BY MOUTH EVERY 6 HOURS AS NEEDED FOR WHEEZE OR SHORTNESS OF BREATH   atorvastatin (LIPITOR) 20 MG tablet Take 1 tablet (20 mg total) by mouth every evening.   doxycycline (VIBRA-TABS) 100 MG tablet Take 1 tablet (100 mg total) by mouth 2 (two) times daily. 1 po bid   fluticasone (FLONASE) 50 MCG/ACT nasal spray Place 2 sprays into both nostrils daily.    fluticasone-salmeterol (ADVAIR) 250-50 MCG/ACT AEPB Inhale 1 puff into the lungs in the morning and at bedtime.   furosemide (LASIX) 20 MG tablet Take 1 tablet (20 mg total) by mouth daily.   gabapentin (NEURONTIN) 300 MG capsule Take 1 capsule (300 mg total) by mouth at bedtime.   lisinopril (ZESTRIL) 40 MG tablet Take 1 tablet (40 mg total) by mouth daily.   No facility-administered encounter medications on file as of 07/07/2022.    No past surgical history on file.  Family History  Problem Relation Age of Onset   Dementia Mother 7   Heart attack Brother    Hyperlipidemia Son    Heart attack Brother    Alcohol abuse Brother    Cancer Brother 31       Lung   Hyperlipidemia Son       Controlled substance contract: n/a     Review of Systems  Constitutional:  Negative for diaphoresis.  Eyes:  Negative for pain.  Respiratory:  Negative for shortness of breath.   Cardiovascular:  Negative for chest pain, palpitations and leg swelling.  Gastrointestinal:  Negative for abdominal pain.  Endocrine: Negative for polydipsia.  Skin:  Negative for rash.  Neurological:  Negative for dizziness, weakness and headaches.  Hematological:  Does not bruise/bleed easily.  All other systems reviewed and are negative.  Objective:   Physical Exam Vitals and nursing note reviewed.  Constitutional:      Appearance: Normal appearance. He is well-developed.  HENT:     Head: Normocephalic.     Nose: Nose normal.     Mouth/Throat:     Mouth: Mucous membranes are moist.     Pharynx: Oropharynx is clear.  Eyes:     Pupils: Pupils are equal, round, and reactive to light.  Neck:     Thyroid: No thyroid mass or thyromegaly.     Vascular: No carotid bruit or JVD.     Trachea: Phonation normal.  Cardiovascular:     Rate and Rhythm: Normal rate and regular rhythm.  Pulmonary:     Effort: Pulmonary effort is normal. No respiratory distress.     Breath sounds: Normal breath sounds.      Comments: O2 via nasal canula at 3 L Abdominal:     General: Bowel sounds are normal.     Palpations: Abdomen is soft.     Tenderness: There is no abdominal tenderness.  Musculoskeletal:        General: Normal range of motion.     Cervical back: Normal range of motion and neck supple.     Right lower leg: Edema (mild) present.     Left lower leg: Edema (mild) present.  Lymphadenopathy:     Cervical: No cervical adenopathy.  Skin:    General: Skin is warm and dry.  Neurological:     Mental Status: He is alert and oriented to person, place, and time.  Psychiatric:        Behavior: Behavior normal.        Thought Content: Thought content normal.        Judgment: Judgment normal.    BP (!) 126/55   Pulse (!) 52   Temp (!) 97.4 F (36.3 C) (Temporal)   Resp 20   Ht 5' 10"  (1.778 m)   Wt 218 lb (98.9 kg)   SpO2 98% Comment: 2 liters  BMI 31.28 kg/m         Assessment & Plan:   Neil Smith comes in today with chief complaint of Medical Management of Chronic Issues   Diagnosis and orders addressed:  1. Primary hypertension Low sodium diet - lisinopril (ZESTRIL) 40 MG tablet; Take 1 tablet (40 mg total) by mouth daily.  Dispense: 90 tablet; Refill: 1 - CBC with Differential/Platelet - CMP14+EGFR  2. Hyperlipidemia with target LDL less than 100 Low fat diet - atorvastatin (LIPITOR) 20 MG tablet; Take 1 tablet (20 mg total) by mouth every evening.  Dispense: 90 tablet; Refill: 1 - Lipid panel  3. Simple chronic bronchitis ([facility]) - fluticasone-salmeterol (ADVAIR) 250-50 MCG/ACT AEPB; Inhale 1 puff into the lungs in the morning and at bedtime.  Dispense: 60 each; Refill: 5  4. Neuropathy Do not go barefooted - gabapentin (NEURONTIN) 300 MG capsule; Take 1 capsule (300 mg total) by mouth at bedtime.  Dispense: 90 capsule; Refill: 1  5. Peripheral edema Elevate legs when sitting - furosemide (LASIX) 20 MG tablet; Take 1 tablet (20 mg total) by mouth daily.   Dispense: 90 tablet; Refill: 1  6. BMI 34.0-34.9,adult Discussed diet and exercise for person with BMI >25 Will recheck weight in 3-6 months    Labs pending Health Maintenance reviewed Diet and exercise encouraged  Follow up plan: 6 months   Mary-Margaret Hassell Done, FNP

## 2022-07-07 NOTE — Patient Instructions (Signed)
Edema ? ?Edema is when you have too much fluid in your body or under your skin. Edema may make your legs, feet, and ankles swell. Swelling often happens in looser tissues, such as around your eyes. This is a common condition. It gets more common as you get older. ?There are many possible causes of edema. These include: ?Eating too much salt (sodium). ?Being on your feet or sitting for a long time. ?Certain medical conditions, such as: ?Pregnancy. ?Heart failure. ?Liver disease. ?Kidney disease. ?Cancer. ?Hot weather may make edema worse. Edema is usually painless. Your skin may look swollen or shiny. ?Follow these instructions at home: ?Medicines ?Take over-the-counter and prescription medicines only as told by your doctor. ?Your doctor may prescribe a medicine to help your body get rid of extra water (diuretic). Take this medicine if you are told to take it. ?Eating and drinking ?Eat a low-salt (low-sodium) diet as told by your doctor. Sometimes, eating less salt may reduce swelling. ?Depending on the cause of your swelling, you may need to limit how much fluid you drink (fluid restriction). ?General instructions ?Raise the injured area above the level of your heart while you are sitting or lying down. ?Do not sit still or stand for a long time. ?Do not wear tight clothes. Do not wear garters on your upper legs. ?Exercise your legs. This can help the swelling go down. ?Wear compression stockings as told by your doctor. It is important that these are the right size. These should be prescribed by your doctor to prevent possible injuries. ?If elastic bandages or wraps are recommended, use them as told by your doctor. ?Contact a doctor if: ?Treatment is not working. ?You have heart, liver, or kidney disease and have symptoms of edema. ?You have sudden and unexplained weight gain. ?Get help right away if: ?You have shortness of breath or chest pain. ?You cannot breathe when you lie down. ?You have pain, redness, or  warmth in the swollen areas. ?You have heart, liver, or kidney disease and get edema all of a sudden. ?You have a fever and your symptoms get worse all of a sudden. ?These symptoms may be an emergency. Get help right away. Call 911. ?Do not wait to see if the symptoms will go away. ?Do not drive yourself to the hospital. ?Summary ?Edema is when you have too much fluid in your body or under your skin. ?Edema may make your legs, feet, and ankles swell. Swelling often happens in looser tissues, such as around your eyes. ?Raise the injured area above the level of your heart while you are sitting or lying down. ?Follow your doctor's instructions about diet and how much fluid you can drink. ?This information is not intended to replace advice given to you by your health care provider. Make sure you discuss any questions you have with your health care provider. ?Document Revised: 06/17/2021 Document Reviewed: 06/17/2021 ?Elsevier Patient Education ? 2023 Elsevier Inc. ? ?

## 2022-07-08 LAB — CBC WITH DIFFERENTIAL/PLATELET
Basophils Absolute: 0 10*3/uL (ref 0.0–0.2)
Basos: 1 %
EOS (ABSOLUTE): 0.1 10*3/uL (ref 0.0–0.4)
Eos: 2 %
Hematocrit: 40 % (ref 37.5–51.0)
Hemoglobin: 13.2 g/dL (ref 13.0–17.7)
Immature Grans (Abs): 0 10*3/uL (ref 0.0–0.1)
Immature Granulocytes: 0 %
Lymphocytes Absolute: 1.4 10*3/uL (ref 0.7–3.1)
Lymphs: 25 %
MCH: 30.2 pg (ref 26.6–33.0)
MCHC: 33 g/dL (ref 31.5–35.7)
MCV: 92 fL (ref 79–97)
Monocytes Absolute: 0.3 10*3/uL (ref 0.1–0.9)
Monocytes: 6 %
Neutrophils Absolute: 3.7 10*3/uL (ref 1.4–7.0)
Neutrophils: 66 %
Platelets: 225 10*3/uL (ref 150–450)
RBC: 4.37 x10E6/uL (ref 4.14–5.80)
RDW: 12.3 % (ref 11.6–15.4)
WBC: 5.5 10*3/uL (ref 3.4–10.8)

## 2022-07-08 LAB — CMP14+EGFR
ALT: 17 IU/L (ref 0–44)
AST: 14 IU/L (ref 0–40)
Albumin/Globulin Ratio: 2.1 (ref 1.2–2.2)
Albumin: 4.4 g/dL (ref 3.7–4.7)
Alkaline Phosphatase: 70 IU/L (ref 44–121)
BUN/Creatinine Ratio: 8 — ABNORMAL LOW (ref 10–24)
BUN: 10 mg/dL (ref 8–27)
Bilirubin Total: 0.3 mg/dL (ref 0.0–1.2)
CO2: 24 mmol/L (ref 20–29)
Calcium: 9.3 mg/dL (ref 8.6–10.2)
Chloride: 106 mmol/L (ref 96–106)
Creatinine, Ser: 1.2 mg/dL (ref 0.76–1.27)
Globulin, Total: 2.1 g/dL (ref 1.5–4.5)
Glucose: 114 mg/dL — ABNORMAL HIGH (ref 70–99)
Potassium: 5.2 mmol/L (ref 3.5–5.2)
Sodium: 141 mmol/L (ref 134–144)
Total Protein: 6.5 g/dL (ref 6.0–8.5)
eGFR: 61 mL/min/{1.73_m2} (ref >59)

## 2022-07-08 LAB — LIPID PANEL
Chol/HDL Ratio: 2.7 ratio (ref 0.0–5.0)
Cholesterol, Total: 110 mg/dL (ref 100–199)
HDL: 41 mg/dL (ref >39)
LDL Chol Calc (NIH): 52 mg/dL (ref 0–99)
Triglycerides: 87 mg/dL (ref 0–149)
VLDL Cholesterol Cal: 17 mg/dL (ref 5–40)

## 2022-09-28 ENCOUNTER — Other Ambulatory Visit: Payer: Self-pay | Admitting: Nurse Practitioner

## 2022-09-30 MED ORDER — ALBUTEROL SULFATE HFA 108 (90 BASE) MCG/ACT IN AERS: INHALATION_SPRAY | RESPIRATORY_TRACT | 2 refills | Status: DC

## 2022-09-30 NOTE — Telephone Encounter (Signed)
Refill failed. resent °

## 2022-09-30 NOTE — Addendum Note (Signed)
Addended by: Julious Payer D on: 09/30/2022 08:09 AM   Modules accepted: Orders

## 2022-11-26 ENCOUNTER — Telehealth: Payer: Self-pay | Admitting: Nurse Practitioner

## 2022-11-26 DIAGNOSIS — J410 Simple chronic bronchitis: Secondary | ICD-10-CM

## 2022-11-26 DIAGNOSIS — R0902 Hypoxemia: Secondary | ICD-10-CM

## 2022-11-27 ENCOUNTER — Other Ambulatory Visit: Payer: Self-pay

## 2022-11-27 DIAGNOSIS — R0902 Hypoxemia: Secondary | ICD-10-CM

## 2022-11-27 NOTE — Telephone Encounter (Signed)
New order placed with 3L/min

## 2022-11-27 NOTE — Telephone Encounter (Signed)
Ok to do order for 3L- I will change note

## 2022-11-27 NOTE — Telephone Encounter (Signed)
Please advise 07/07/22 OV note has pt is on 2L, TC from pt states he is on 3L All signed orders under media are for 4L, which he is wanting a new order sent to [place] his new O2 company for 3L

## 2022-11-27 NOTE — Telephone Encounter (Signed)
Neil Smith is calling back about this message. The new company information contact April Edward-- phone [phone] fax [phone]. Neil Smith wants a call back

## 2022-11-28 NOTE — Telephone Encounter (Addendum)
Aware new order for O2 @ 3L for [place] faxed to April Edward at [phone]

## 2022-12-04 NOTE — Telephone Encounter (Signed)
[facility] sent Tammy an email that they have not received the rx. Please re fax to [phone] Attention [place]. Need rx for portable concentrator and titration test stating that pt uses 3L of oxygen.  Pt is on his last oxygen tank. Tammy asked for a call back

## 2022-12-04 NOTE — Addendum Note (Signed)
Addended by: Antonietta Barcelona D on: 12/04/2022 02:06 PM   Modules accepted: Orders

## 2022-12-04 NOTE — Telephone Encounter (Signed)
Updated order faxed to [place]: Britney at [phone]

## 2022-12-22 NOTE — Addendum Note (Signed)
Addended by: Antonietta Barcelona D on: 12/22/2022 05:03 PM   Modules accepted: Orders

## 2022-12-22 NOTE — Telephone Encounter (Signed)
Tammy asking to talk to [facility] about pt oxygen rx. He needs a rx for portable concentrator and a tirtation test. Please call back

## 2022-12-22 NOTE — Telephone Encounter (Signed)
New order placed and given to PCP to sign

## 2022-12-23 ENCOUNTER — Telehealth: Payer: Self-pay | Admitting: Nurse Practitioner

## 2022-12-23 NOTE — Telephone Encounter (Signed)
Updated order w/ comments of POC with titration testing To [facility] Attn: Britney at [phone] as well Attn: [facility] at [place] has talked to [facility] a couple time getting conflicting information that they don't do the titration testing then by the end of the conversation said when they get the order they will call to set up for the titration testing.

## 2022-12-23 NOTE — Telephone Encounter (Signed)
Closing encounter, duplicate call

## 2022-12-31 ENCOUNTER — Ambulatory Visit (INDEPENDENT_AMBULATORY_CARE_PROVIDER_SITE_OTHER): Payer: Medicare HMO

## 2022-12-31 VITALS — Ht 70.0 in | Wt 218.0 lb

## 2022-12-31 DIAGNOSIS — Z0000 Encounter for general adult medical examination without abnormal findings: Secondary | ICD-10-CM

## 2022-12-31 NOTE — Patient Instructions (Signed)
Mr. Neil Smith , Thank you for taking time to come for your Medicare Wellness Visit. I appreciate your ongoing commitment to your health goals. Please review the following plan we discussed and let me know if I can assist you in the future.   These are the goals we discussed:  Goals      Exercise 3x per week (30 min per time)     Have 3 meals a day        This is a list of the screening recommended for you and due dates:  Health Maintenance  Topic Date Due   COVID-19 Vaccine (1) Never done   Zoster (Shingles) Vaccine (1 of 2) Never done   Flu Shot  01/25/2023*   Medicare Annual Wellness Visit  12/31/2023   DTaP/Tdap/Td vaccine (2 - Td or Tdap) 01/01/2032   Pneumonia Vaccine  Completed   HPV Vaccine  Aged Out  *Topic was postponed. The date shown is not the original due date.    Advanced directives: Advance directive discussed with you today. I have provided a copy for you to complete at home and have notarized. Once this is complete please bring a copy in to our office so we can scan it into your chart.   Conditions/risks identified: Aim for 30 minutes of exercise or brisk walking, 6-8 glasses of water, and 5 servings of fruits and vegetables each day.   Next appointment: Follow up in one year for your annual wellness visit.   Preventive Care 20 Years and Older, Male  Preventive care refers to lifestyle choices and visits with your health care provider that can promote health and wellness. What does preventive care include? A yearly physical exam. This is also called an annual well check. Dental exams once or twice a year. Routine eye exams. Ask your health care provider how often you should have your eyes checked. Personal lifestyle choices, including: Daily care of your teeth and gums. Regular physical activity. Eating a healthy diet. Avoiding tobacco and drug use. Limiting alcohol use. Practicing safe sex. Taking low doses of aspirin every day. Taking vitamin and  mineral supplements as recommended by your health care provider. What happens during an annual well check? The services and screenings done by your health care provider during your annual well check will depend on your age, overall health, lifestyle risk factors, and family history of disease. Counseling  Your health care provider may ask you questions about your: Alcohol use. Tobacco use. Drug use. Emotional well-being. Home and relationship well-being. Sexual activity. Eating habits. History of falls. Memory and ability to understand (cognition). Work and work Statistician. Screening  You may have the following tests or measurements: Height, weight, and BMI. Blood pressure. Lipid and cholesterol levels. These may be checked every 5 years, or more frequently if you are over 30 years old. Skin check. Lung cancer screening. You may have this screening every year starting at age 41 if you have a 30-pack-year history of smoking and currently smoke or have quit within the past 15 years. Fecal occult blood test (FOBT) of the stool. You may have this test every year starting at age 25. Flexible sigmoidoscopy or colonoscopy. You may have a sigmoidoscopy every 5 years or a colonoscopy every 10 years starting at age 34. Prostate cancer screening. Recommendations will vary depending on your family history and other risks. Hepatitis C blood test. Hepatitis B blood test. Sexually transmitted disease (STD) testing. Diabetes screening. This is done by checking your blood sugar (  glucose) after you have not eaten for a while (fasting). You may have this done every 1-3 years. Abdominal aortic aneurysm (AAA) screening. You may need this if you are a current or former smoker. Osteoporosis. You may be screened starting at age 59 if you are at high risk. Talk with your health care provider about your test results, treatment options, and if necessary, the need for more tests. Vaccines  Your health care  provider may recommend certain vaccines, such as: Influenza vaccine. This is recommended every year. Tetanus, diphtheria, and acellular pertussis (Tdap, Td) vaccine. You may need a Td booster every 10 years. Zoster vaccine. You may need this after age 75. Pneumococcal 13-valent conjugate (PCV13) vaccine. One dose is recommended after age 21. Pneumococcal polysaccharide (PPSV23) vaccine. One dose is recommended after age 23. Talk to your health care provider about which screenings and vaccines you need and how often you need them. This information is not intended to replace advice given to you by your health care provider. Make sure you discuss any questions you have with your health care provider. Document Released: 11/09/2015 Document Revised: 07/02/2016 Document Reviewed: 08/14/2015 Elsevier Interactive Patient Education  2017 [employer] Prevention in the Home Falls can cause injuries. They can happen to people of all ages. There are many things you can do to make your home safe and to help prevent falls. What can I do on the outside of my home? Regularly fix the edges of walkways and driveways and fix any cracks. Remove anything that might make you trip as you walk through a door, such as a raised step or threshold. Trim any bushes or trees on the path to your home. Use bright outdoor lighting. Clear any walking paths of anything that might make someone trip, such as rocks or tools. Regularly check to see if handrails are loose or broken. Make sure that both sides of any steps have handrails. Any raised decks and porches should have guardrails on the edges. Have any leaves, snow, or ice cleared regularly. Use sand or salt on walking paths during winter. Clean up any spills in your garage right away. This includes oil or grease spills. What can I do in the bathroom? Use night lights. Install grab bars by the toilet and in the tub and shower. Do not use towel bars as grab  bars. Use non-skid mats or decals in the tub or shower. If you need to sit down in the shower, use a plastic, non-slip stool. Keep the floor dry. Clean up any water that spills on the floor as soon as it happens. Remove soap buildup in the tub or shower regularly. Attach bath mats securely with double-sided non-slip rug tape. Do not have throw rugs and other things on the floor that can make you trip. What can I do in the bedroom? Use night lights. Make sure that you have a light by your bed that is easy to reach. Do not use any sheets or blankets that are too big for your bed. They should not hang down onto the floor. Have a firm chair that has side arms. You can use this for support while you get dressed. Do not have throw rugs and other things on the floor that can make you trip. What can I do in the kitchen? Clean up any spills right away. Avoid walking on wet floors. Keep items that you use a lot in easy-to-reach places. If you need to reach something above you, use  a strong step stool that has a grab bar. Keep electrical cords out of the way. Do not use floor polish or wax that makes floors slippery. If you must use wax, use non-skid floor wax. Do not have throw rugs and other things on the floor that can make you trip. What can I do with my stairs? Do not leave any items on the stairs. Make sure that there are handrails on both sides of the stairs and use them. Fix handrails that are broken or loose. Make sure that handrails are as long as the stairways. Check any carpeting to make sure that it is firmly attached to the stairs. Fix any carpet that is loose or worn. Avoid having throw rugs at the top or bottom of the stairs. If you do have throw rugs, attach them to the floor with carpet tape. Make sure that you have a light switch at the top of the stairs and the bottom of the stairs. If you do not have them, ask someone to add them for you. What else can I do to help prevent  falls? Wear shoes that: Do not have high heels. Have rubber bottoms. Are comfortable and fit you well. Are closed at the toe. Do not wear sandals. If you use a stepladder: Make sure that it is fully opened. Do not climb a closed stepladder. Make sure that both sides of the stepladder are locked into place. Ask someone to hold it for you, if possible. Clearly mark and make sure that you can see: Any grab bars or handrails. First and last steps. Where the edge of each step is. Use tools that help you move around (mobility aids) if they are needed. These include: Canes. Walkers. Scooters. Crutches. Turn on the lights when you go into a dark area. Replace any light bulbs as soon as they burn out. Set up your furniture so you have a clear path. Avoid moving your furniture around. If any of your floors are uneven, fix them. If there are any pets around you, be aware of where they are. Review your medicines with your doctor. Some medicines can make you feel dizzy. This can increase your chance of falling. Ask your doctor what other things that you can do to help prevent falls. This information is not intended to replace advice given to you by your health care provider. Make sure you discuss any questions you have with your health care provider. Document Released: 08/09/2009 Document Revised: 03/20/2016 Document Reviewed: 11/17/2014 Elsevier Interactive Patient Education  2017 [employer].

## 2022-12-31 NOTE — Progress Notes (Signed)
Subjective:   Neil Smith is a 82 y.o. male who presents for Medicare Annual/Subsequent preventive examination. I connected with  Neil Smith on 12/31/22 by a audio enabled telemedicine application and verified that I am speaking with the correct person using two identifiers.  Patient Location: Home  Provider Location: Home Office  I discussed the limitations of evaluation and management by telemedicine. The patient expressed understanding and agreed to proceed.  Review of Systems     Cardiac Risk Factors include: advanced age (>46mn, >>60women);male gender;hypertension;dyslipidemia     Objective:    Today's Vitals   12/31/22 0915  Weight: 218 lb (98.9 kg)  Height: '5\' 10"'$  (1.778 m)   Body mass index is 31.28 kg/m.     12/31/2022    9:18 AM 09/09/2021    4:05 PM 02/01/2020    1:36 PM 05/09/2015    9:15 AM  Advanced Directives  Does Patient Have a Medical Advance Directive? No No No Yes;No  Does patient want to make changes to medical advance directive?    Yes - information given  Would patient like information on creating a medical advance directive? No - Patient declined No - Patient declined No - Patient declined     Current Medications (verified) Outpatient Encounter Medications as of 12/31/2022  Medication Sig   albuterol (VENTOLIN HFA) 108 (90 Base) MCG/ACT inhaler TAKE 2 PUFFS BY MOUTH EVERY 6 HOURS AS NEEDED FOR WHEEZE OR SHORTNESS OF BREATH   atorvastatin (LIPITOR) 20 MG tablet Take 1 tablet (20 mg total) by mouth every evening.   fluticasone (FLONASE) 50 MCG/ACT nasal spray Place 2 sprays into both nostrils daily.   fluticasone-salmeterol (ADVAIR) 250-50 MCG/ACT AEPB Inhale 1 puff into the lungs in the morning and at bedtime.   furosemide (LASIX) 20 MG tablet Take 1 tablet (20 mg total) by mouth daily.   gabapentin (NEURONTIN) 300 MG capsule Take 1 capsule (300 mg total) by mouth at bedtime.   lisinopril (ZESTRIL) 40 MG tablet Take 1 tablet (40 mg total)  by mouth daily.   No facility-administered encounter medications on file as of 12/31/2022.    Allergies (verified) Patient has no known allergies.   History: Past Medical History:  Diagnosis Date   [place]    Hyperlipidemia    Hypertension    History reviewed. No pertinent surgical history. 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 Tobacco Use   Smoking status: Former    Types: Cigarettes    Quit date: 10/14/2002    Years since quitting: 20.2   Smokeless tobacco: Never  Vaping Use   Vaping Use: Never used  Substance and Sexual Activity   Alcohol use: No   Drug use: No   Sexual activity: Not Currently  Other Topics Concern   Not on file  Social History Narrative   2 sons   Social Determinants of Health  Financial Resource Strain: Low Risk  (12/31/2022)   Overall Financial Resource Strain (CARDIA)    Difficulty of Paying Living Expenses: Not hard at all  Food Insecurity: No Food Insecurity (12/31/2022)   Hunger Vital Sign    Worried About Running Out of Food in the Last Year: Never true    Ran Out of Food in the Last Year: Never true  Transportation Needs: No Transportation Needs (12/31/2022)   PRAPARE - Hydrologist (Medical): No    Lack of Transportation (Non-Medical): No  Physical Activity: Insufficiently Active (12/31/2022)   Exercise Vital Sign    Days of Exercise per Week: 3 days    Minutes of Exercise per Session: 30 min  Stress: No Stress Concern Present (12/31/2022)    [place]    Feeling of Stress : Not at all  Social Connections: Socially Isolated (12/31/2022)   Social Connection and Isolation Panel [NHANES]    Frequency of Communication with Friends and Family: More than three times a week    Frequency of Social Gatherings with Friends and Family: More than three times a week    Attends Religious Services: Never    Marine scientist or Organizations: No    Attends Archivist Meetings: Never    Marital Status: Widowed    Tobacco Counseling Counseling given: Not Answered   Clinical Intake:  Pre-visit preparation completed: Yes  Pain : No/denies pain     Nutritional Risks: None Diabetes: No  How often do you need to have someone help you when you read instructions, pamphlets, or other written materials from your doctor or pharmacy?: 1 - Never  Diabetic?no   Interpreter Needed?: No  Information entered by :: Jadene Pierini, LPN   Activities of Daily Living    12/31/2022    9:18 AM  In your present state of health, do you have any difficulty performing the following activities:  Hearing? 0  Vision? 0  Difficulty concentrating or making decisions? 0  Walking or climbing stairs? 0  Dressing or bathing? 0  Doing errands, shopping? 0  Preparing Food and eating ? N  Using the Toilet? N  In the past six months, have you accidently leaked urine? N  Do you have problems with loss of bowel control? N  Managing your Medications? N  Managing your Finances? N  Housekeeping or managing your Housekeeping? N    Patient Care Team: Chevis Pretty, FNP as PCP - General (Nurse Practitioner)  Indicate any recent Medical Services you may have received from other than [facility] providers in the past year (date may be approximate).     Assessment:   This is a routine wellness examination for Neil Smith.  Hearing/Vision screen Vision Screening - Comments:: Wears rx  glasses - up to date with routine eye exams with  Dr.Lee   Dietary issues and exercise activities discussed: Current Exercise Habits: Home exercise routine, Type of exercise: walking, Time (Minutes): 30, Frequency (Times/Week): 3, Weekly Exercise (Minutes/Week): 90, Intensity: Mild, Exercise limited by: None identified   Goals Addressed             This Visit's Progress    Have 3 meals a day   On track      Depression Screen    12/31/2022    9:17 AM 07/07/2022    8:00 AM 01/27/2022   12:28 PM 12/31/2021    8:02 AM 09/09/2021    4:01 PM  07/02/2021   10:16 AM 09/18/2020    3:59 PM  PHQ 2/9 Scores  PHQ - 2 Score 0 0 0 0 0 0 0  PHQ- 9 Score  0  0  0     Fall Risk    12/31/2022    9:16 AM 07/07/2022    8:00 AM 01/27/2022   12:28 PM 12/31/2021    8:02 AM 09/09/2021    4:05 PM  Fall Risk   Falls in the past year? 0 0 0 0 0  Number falls in past yr: 0    0  Injury with Fall? 0    0  Risk for fall due to : No Fall Risks    Impaired balance/gait;Impaired mobility  Follow up Falls prevention discussed    Falls prevention discussed    FALL RISK PREVENTION PERTAINING TO THE HOME:  Any stairs in or around the home? No  If so, are there any without handrails? No  Home free of loose throw rugs in walkways, pet beds, electrical cords, etc? Yes  Adequate lighting in your home to reduce risk of falls? Yes   ASSISTIVE DEVICES UTILIZED TO PREVENT FALLS:  Life alert? No  Use of a cane, walker or w/c? No  Grab bars in the bathroom? No  Shower chair or bench in shower? No  Elevated toilet seat or a handicapped toilet? No       10/14/2017    4:28 PM  MMSE - Mini Mental State Exam  Not completed: Unable to complete        12/31/2022    9:19 AM 02/01/2020    1:39 PM  6CIT Screen  What Year? 0 points 4 points  What month? 0 points 0 points  What time? 0 points 0 points  Count back from 20 0 points 0 points  Months in reverse 0 points 0 points  Repeat phrase 0 points 0 points  Total  Score 0 points 4 points    Immunizations Immunization History  Administered Date(s) Administered   Fluad Quad(high Dose 65+) 07/25/2019, 09/18/2020   Influenza, High Dose Seasonal PF 07/03/2014, 07/28/2016, 08/25/2017, 09/29/2018, 09/11/2021   Influenza,inj,Quad PF,6+ Mos 08/15/2013, 07/30/2015   Pneumococcal Conjugate-13 05/09/2015   Pneumococcal Polysaccharide-23 10/14/2017   Tdap 12/31/2021    TDAP status: Up to date  Flu Vaccine status: Up to date  Pneumococcal vaccine status: Up to date  Covid-19 vaccine status: Completed vaccines  Qualifies for Shingles Vaccine? Yes   Zostavax completed No   Shingrix Completed?: No.    Education has been provided regarding the importance of this vaccine. Patient has been advised to call insurance company to determine out of pocket expense if they have not yet received this vaccine. Advised may also receive vaccine at local pharmacy or Health Dept. Verbalized acceptance and understanding.  Screening Tests Health Maintenance  Topic Date Due   COVID-19 Vaccine (1) Never done   Zoster Vaccines- Shingrix (1 of 2) Never done   INFLUENZA VACCINE  01/25/2023 (Originally 05/27/2022)   Medicare Annual Wellness (AWV)  12/31/2023   DTaP/Tdap/Td (2 - Td or Tdap) 01/01/2032   Pneumonia Vaccine 37+ Years old  Completed   HPV VACCINES  Aged Out    Health Maintenance  Health Maintenance Due  Topic Date Due   COVID-19 Vaccine (1) Never done   Zoster Vaccines- Shingrix (1 of 2) Never done    Colorectal cancer screening: No longer required.   Lung Cancer Screening: (Low Dose CT Chest recommended if  Age 69-80 years, 30 pack-year currently smoking OR have quit w/in 15years.) does not qualify.   Lung Cancer Screening Referral: n/a  Additional Screening:  Hepatitis C Screening: does not qualify;   Vision Screening: Recommended annual ophthalmology exams for early detection of glaucoma and other disorders of the eye. Is the patient up to date with  their annual eye exam?  Yes  Who is the provider or what is the name of the office in which the patient attends annual eye exams? Dr.Lee  If pt is not established with a provider, would they like to be referred to a provider to establish care? No .   Dental Screening: Recommended annual dental exams for proper oral hygiene  Community Resource Referral / Chronic Care Management: CRR required this visit?  No   CCM required this visit?  No      Plan:     I have personally reviewed and noted the following in the patient's chart:   Medical and social history Use of alcohol, tobacco or illicit drugs  Current medications and supplements including opioid prescriptions. Patient is not currently taking opioid prescriptions. Functional ability and status Nutritional status Physical activity Advanced directives List of other physicians Hospitalizations, surgeries, and ER visits in previous 12 months Vitals Screenings to include cognitive, depression, and falls Referrals and appointments  In addition, I have reviewed and discussed with patient certain preventive protocols, quality metrics, and best practice recommendations. A written personalized care plan for preventive services as well as general preventive health recommendations were provided to patient.     Daphane Shepherd, LPN   [id number]   Nurse Notes: none

## 2023-01-05 ENCOUNTER — Ambulatory Visit: Payer: Medicare HMO | Admitting: Nurse Practitioner

## 2023-03-18 ENCOUNTER — Other Ambulatory Visit: Payer: Self-pay | Admitting: Nurse Practitioner

## 2023-03-25 ENCOUNTER — Other Ambulatory Visit: Payer: Self-pay | Admitting: Nurse Practitioner

## 2023-03-25 DIAGNOSIS — E785 Hyperlipidemia, unspecified: Secondary | ICD-10-CM

## 2023-03-27 ENCOUNTER — Other Ambulatory Visit: Payer: Self-pay | Admitting: Nurse Practitioner

## 2023-03-27 DIAGNOSIS — G629 Polyneuropathy, unspecified: Secondary | ICD-10-CM

## 2023-04-29 ENCOUNTER — Other Ambulatory Visit: Payer: Self-pay | Admitting: Nurse Practitioner

## 2023-04-29 DIAGNOSIS — E785 Hyperlipidemia, unspecified: Secondary | ICD-10-CM

## 2023-04-29 MED ORDER — ATORVASTATIN CALCIUM 20 MG PO TABS
20.0000 mg | ORAL_TABLET | Freq: Every day | ORAL | 0 refills | Status: DC
Start: 2023-04-29 — End: 2023-05-14

## 2023-04-29 NOTE — Telephone Encounter (Signed)
MMM pt NTBS 30-d given 03/25/23

## 2023-04-29 NOTE — Addendum Note (Signed)
Addended by: Julious Payer D on: 04/29/2023 11:23 AM   Modules accepted: Orders

## 2023-04-29 NOTE — Telephone Encounter (Signed)
PT'S son made appt  on July 18 with mmm

## 2023-05-01 ENCOUNTER — Telehealth: Payer: Self-pay | Admitting: Nurse Practitioner

## 2023-05-01 DIAGNOSIS — Z0279 Encounter for issue of other medical certificate: Secondary | ICD-10-CM

## 2023-05-01 NOTE — Telephone Encounter (Signed)
Neil Smith (patients daughter in law) dropped off handicap forms to be completed and signed.  Form Fee Paid? (Y/N)       YES     If YES, then form will be placed in the RX/HH Nurse Coordinators box for completion.

## 2023-05-04 NOTE — Telephone Encounter (Signed)
Information completed and forwarded to PCP 

## 2023-05-08 NOTE — Telephone Encounter (Signed)
Aware handicap form ready

## 2023-05-14 ENCOUNTER — Ambulatory Visit (INDEPENDENT_AMBULATORY_CARE_PROVIDER_SITE_OTHER): Payer: Medicare HMO | Admitting: Nurse Practitioner

## 2023-05-14 ENCOUNTER — Encounter: Payer: Self-pay | Admitting: Nurse Practitioner

## 2023-05-14 VITALS — BP 141/59 | HR 67 | Temp 98.2°F | Resp 20 | Ht 70.0 in | Wt 208.0 lb

## 2023-05-14 DIAGNOSIS — J410 Simple chronic bronchitis: Secondary | ICD-10-CM | POA: Diagnosis not present

## 2023-05-14 DIAGNOSIS — E785 Hyperlipidemia, unspecified: Secondary | ICD-10-CM | POA: Diagnosis not present

## 2023-05-14 DIAGNOSIS — G629 Polyneuropathy, unspecified: Secondary | ICD-10-CM

## 2023-05-14 DIAGNOSIS — Z6829 Body mass index (BMI) 29.0-29.9, adult: Secondary | ICD-10-CM

## 2023-05-14 DIAGNOSIS — R600 Localized edema: Secondary | ICD-10-CM

## 2023-05-14 DIAGNOSIS — I10 Essential (primary) hypertension: Secondary | ICD-10-CM | POA: Diagnosis not present

## 2023-05-14 DIAGNOSIS — Z6834 Body mass index (BMI) 34.0-34.9, adult: Secondary | ICD-10-CM

## 2023-05-14 MED ORDER — LISINOPRIL 40 MG PO TABS
40.0000 mg | ORAL_TABLET | Freq: Every day | ORAL | 1 refills | Status: DC
Start: 2023-05-14 — End: 2024-01-11

## 2023-05-14 MED ORDER — FUROSEMIDE 20 MG PO TABS
20.0000 mg | ORAL_TABLET | Freq: Every day | ORAL | 1 refills | Status: DC
Start: 2023-05-14 — End: 2024-01-11

## 2023-05-14 MED ORDER — ATORVASTATIN CALCIUM 20 MG PO TABS: 20.0000 mg | ORAL_TABLET | Freq: Every day | ORAL | 1 refills | Status: DC

## 2023-05-14 MED ORDER — FLUTICASONE-SALMETEROL 250-50 MCG/ACT IN AEPB: 1.0000 | INHALATION_SPRAY | Freq: Two times a day (BID) | RESPIRATORY_TRACT | 5 refills | Status: DC

## 2023-05-14 MED ORDER — GABAPENTIN 300 MG PO CAPS: 300.0000 mg | ORAL_CAPSULE | Freq: Every day | ORAL | 1 refills | Status: DC

## 2023-05-14 NOTE — Progress Notes (Signed)
Subjective:    Patient ID: Neil Smith, male    DOB: [birth year], 82 y.o.   MRN: [id number]   Chief Complaint: medical management of chronic issues     HPI:  Neil Smith is a 82 y.o. who identifies as a male who was assigned male at birth.   Social history: Lives with: wife Work history: retired   Water engineer in today for follow up of the following chronic medical issues:  1. Primary hypertension No c/o chest pain, sob or headache. Does not check blood pressure at home. BP Readings from Last 3 Encounters:  07/07/22 (!) 126/55  01/27/22 (!) 154/61  12/31/21 138/64     2. Hyperlipidemia with target LDL less than 100 Does not watch diet and does no dedicated exercise Lab Results  Component Value Date   CHOL 110 07/07/2022   HDL 41 07/07/2022   LDLCALC 52 07/07/2022   TRIG 87 07/07/2022   CHOLHDL 2.7 07/07/2022     3. Simple chronic bronchitis (HCC) Is on advair daily and is doing well. Is on oxygen 24/7 at 2L via nasal cannula.  4. Neuropathy Has numbness in bil feet  5. Peripheral edema Has some daily edema  6. BMI 34.0-34.9,adult No recent weight changes  Wt Readings from Last 3 Encounters:  05/14/23 208 lb (94.3 kg)  12/31/22 218 lb (98.9 kg)  07/07/22 218 lb (98.9 kg)   BMI Readings from Last 3 Encounters:  05/14/23 29.84 kg/m  12/31/22 31.28 kg/m  07/07/22 31.28 kg/m        New complaints: None today  No Known Allergies Outpatient Encounter Medications as of 05/14/2023  Medication Sig   albuterol (VENTOLIN HFA) 108 (90 Base) MCG/ACT inhaler INHALE 2 PUFFS INTO THE LUNGS EVERY 6 HOURS AS NEEDED FOR WHEEZE OR SHORTNESS OF BREATH (NEEDS TO BE SEEN BEFORE NEXT REFILL)   atorvastatin (LIPITOR) 20 MG tablet Take 1 tablet (20 mg total) by mouth daily.   fluticasone (FLONASE) 50 MCG/ACT nasal spray Place 2 sprays into both nostrils daily.   fluticasone-salmeterol (ADVAIR) 250-50 MCG/ACT AEPB Inhale 1 puff into the lungs in the morning and  at bedtime.   furosemide (LASIX) 20 MG tablet Take 1 tablet (20 mg total) by mouth daily.   gabapentin (NEURONTIN) 300 MG capsule Take 1 capsule (300 mg total) by mouth at bedtime. (NEEDS TO BE SEEN BEFORE NEXT REFILL)   lisinopril (ZESTRIL) 40 MG tablet Take 1 tablet (40 mg total) by mouth daily.   No facility-administered encounter medications on file as of 05/14/2023.    No past surgical history on file.  Family History  Problem Relation Age of Onset   Dementia Mother 59   Heart attack Brother    Hyperlipidemia Son    Heart attack Brother    Alcohol abuse Brother    Cancer Brother 48       Lung   Hyperlipidemia Son       Controlled substance contract: n/a     Review of Systems  Constitutional:  Negative for diaphoresis.  Eyes:  Negative for pain.  Respiratory:  Negative for shortness of breath.   Cardiovascular:  Negative for chest pain, palpitations and leg swelling.  Gastrointestinal:  Negative for abdominal pain.  Endocrine: Negative for polydipsia.  Skin:  Negative for rash.  Neurological:  Negative for dizziness, weakness and headaches.  Hematological:  Does not bruise/bleed easily.  All other systems reviewed and are negative.      Objective:   Physical Exam  Vitals and nursing note reviewed.  Constitutional:      Appearance: Normal appearance. He is well-developed.  HENT:     Head: Normocephalic.     Nose: Nose normal.     Mouth/Throat:     Mouth: Mucous membranes are moist.     Pharynx: Oropharynx is clear.  Eyes:     Pupils: Pupils are equal, round, and reactive to light.  Neck:     Thyroid: No thyroid mass or thyromegaly.     Vascular: No carotid bruit or JVD.     Trachea: Phonation normal.  Cardiovascular:     Rate and Rhythm: Normal rate and regular rhythm.  Pulmonary:     Effort: Pulmonary effort is normal. No respiratory distress.     Breath sounds: Normal breath sounds.  Abdominal:     General: Bowel sounds are normal.     Palpations:  Abdomen is soft.     Tenderness: There is no abdominal tenderness.  Musculoskeletal:        General: Normal range of motion.     Cervical back: Normal range of motion and neck supple.  Lymphadenopathy:     Cervical: No cervical adenopathy.  Skin:    General: Skin is warm and dry.  Neurological:     Mental Status: He is alert and oriented to person, place, and time.  Psychiatric:        Behavior: Behavior normal.        Thought Content: Thought content normal.        Judgment: Judgment normal.     BP (!) 141/59   Pulse 67   Temp 98.2 F (36.8 C) (Temporal)   Resp 20   Ht 5\' 10"  (1.778 m)   Wt 208 lb (94.3 kg)   SpO2 92% Comment: 3 liters O2  BMI 29.84 kg/m         Assessment & Plan:  Neil Smith comes in today with chief complaint of Medical Management of Chronic Issues (Has a nagging cough)   Diagnosis and orders addressed:  1. Primary hypertension Low sodium diet - lisinopril (ZESTRIL) 40 MG tablet; Take 1 tablet (40 mg total) by mouth daily.  Dispense: 90 tablet; Refill: 1 - CBC with Differential/Platelet - CMP14+EGFR  2. Hyperlipidemia with target LDL less than 100 Low fat diet - atorvastatin (LIPITOR) 20 MG tablet; Take 1 tablet (20 mg total) by mouth daily.  Dispense: 90 tablet; Refill: 1 - Lipid panel  3. Simple chronic bronchitis (HCC) Continue oxygen - fluticasone-salmeterol (ADVAIR) 250-50 MCG/ACT AEPB; Inhale 1 puff into the lungs in the morning and at bedtime.  Dispense: 60 each; Refill: 5  4. Neuropathy - gabapentin (NEURONTIN) 300 MG capsule; Take 1 capsule (300 mg total) by mouth at bedtime.  Dispense: 90 capsule; Refill: 1  5. Peripheral edema Elevate legs when sitting - furosemide (LASIX) 20 MG tablet; Take 1 tablet (20 mg total) by mouth daily.  Dispense: 90 tablet; Refill: 1  6. BMI 34.0-34.9,adult Discussed diet and exercise for person with BMI >25 Will recheck weight in 3-6 months    Labs pending Health Maintenance  reviewed Diet and exercise encouraged  Follow up plan: 6 Months    Mary-Margaret Daphine Deutscher, FNP

## 2023-05-14 NOTE — Patient Instructions (Signed)
Peripheral Edema  Peripheral edema is swelling that is caused by a buildup of fluid. Peripheral edema most often affects the lower legs, ankles, and feet. It can also develop in the arms, hands, and face. The area of the body that has peripheral edema will look swollen. It may also feel heavy or warm. Your clothes may start to feel tight. Pressing on the area may make a temporary dent in your skin (pitting edema). You may not be able to move your swollen arm or leg as much as usual. There are many causes of peripheral edema. It can happen because of a complication of other conditions such as heart failure, kidney disease, or a problem with your circulation. It also can be a side effect of certain medicines or happen because of an infection. It often happens to women during pregnancy. Sometimes, the cause is not known. Follow these instructions at home: Managing pain, stiffness, and swelling  Raise (elevate) your legs while you are sitting or lying down. Move around often to prevent stiffness and to reduce swelling. Do not sit or stand for long periods of time. Do not wear tight clothing. Do not wear garters on your upper legs. Exercise your legs to get your circulation going. This helps to move the fluid back into your blood vessels, and it may help the swelling go down. Wear compression stockings as told by your health care provider. These stockings help to prevent blood clots and reduce swelling in your legs. It is important that these are the correct size. These stockings should be prescribed by your doctor to prevent possible injuries. If elastic bandages or wraps are recommended, use them as told by your health care provider. Medicines Take over-the-counter and prescription medicines only as told by your health care provider. Your health care provider may prescribe medicine to help your body get rid of excess water (diuretic). Take this medicine if you are told to take it. General  instructions Eat a low-salt (low-sodium) diet as told by your health care provider. Sometimes, eating less salt may reduce swelling. Pay attention to any changes in your symptoms. Moisturize your skin daily to help prevent skin from cracking and draining. Keep all follow-up visits. This is important. Contact a health care provider if: You have a fever. You have swelling in only one leg. You have increased swelling, redness, or pain in one or both of your legs. You have drainage or sores at the area where you have edema. Get help right away if: You have edema that starts suddenly or is getting worse, especially if you are pregnant or have a medical condition. You develop shortness of breath, especially when you are lying down. You have pain in your chest or abdomen. You feel weak. You feel like you will faint. These symptoms may be an emergency. Get help right away. Call 911. Do not wait to see if the symptoms will go away. Do not drive yourself to the hospital. Summary Peripheral edema is swelling that is caused by a buildup of fluid. Peripheral edema most often affects the lower legs, ankles, and feet. Move around often to prevent stiffness and to reduce swelling. Do not sit or stand for long periods of time. Pay attention to any changes in your symptoms. Contact a health care provider if you have edema that starts suddenly or is getting worse, especially if you are pregnant or have a medical condition. Get help right away if you develop shortness of breath, especially when lying down.   This information is not intended to replace advice given to you by your health care provider. Make sure you discuss any questions you have with your health care provider. Document Revised: 06/17/2021 Document Reviewed: 06/17/2021 Elsevier Patient Education  2024 [employer]  

## 2023-05-15 LAB — CBC WITH DIFFERENTIAL/PLATELET
Basophils Absolute: 0 10*3/uL (ref 0.0–0.2)
Basos: 0 %
EOS (ABSOLUTE): 0.2 10*3/uL (ref 0.0–0.4)
Eos: 2 %
Hematocrit: 39.8 % (ref 37.5–51.0)
Hemoglobin: 13.1 g/dL (ref 13.0–17.7)
Immature Grans (Abs): 0 10*3/uL (ref 0.0–0.1)
Immature Granulocytes: 0 %
Lymphocytes Absolute: 1.5 10*3/uL (ref 0.7–3.1)
Lymphs: 21 %
MCH: 30 pg (ref 26.6–33.0)
MCHC: 32.9 g/dL (ref 31.5–35.7)
MCV: 91 fL (ref 79–97)
Monocytes Absolute: 0.5 10*3/uL (ref 0.1–0.9)
Monocytes: 7 %
Neutrophils Absolute: 5.2 10*3/uL (ref 1.4–7.0)
Neutrophils: 70 %
Platelets: 289 10*3/uL (ref 150–450)
RBC: 4.37 x10E6/uL (ref 4.14–5.80)
RDW: 12.4 % (ref 11.6–15.4)
WBC: 7.4 10*3/uL (ref 3.4–10.8)

## 2023-05-15 LAB — CMP14+EGFR
ALT: 28 IU/L (ref 0–44)
AST: 22 IU/L (ref 0–40)
Albumin: 4.1 g/dL (ref 3.7–4.7)
Alkaline Phosphatase: 84 IU/L (ref 44–121)
BUN/Creatinine Ratio: 12 (ref 10–24)
BUN: 15 mg/dL (ref 8–27)
Bilirubin Total: 0.4 mg/dL (ref 0.0–1.2)
CO2: 26 mmol/L (ref 20–29)
Calcium: 9.2 mg/dL (ref 8.6–10.2)
Chloride: 102 mmol/L (ref 96–106)
Creatinine, Ser: 1.22 mg/dL (ref 0.76–1.27)
Globulin, Total: 2.6 g/dL (ref 1.5–4.5)
Glucose: 88 mg/dL (ref 70–99)
Potassium: 5 mmol/L (ref 3.5–5.2)
Sodium: 143 mmol/L (ref 134–144)
Total Protein: 6.7 g/dL (ref 6.0–8.5)
eGFR: 59 mL/min/{1.73_m2} — ABNORMAL LOW (ref >59)

## 2023-05-15 LAB — LIPID PANEL
Chol/HDL Ratio: 4 ratio (ref 0.0–5.0)
Cholesterol, Total: 143 mg/dL (ref 100–199)
HDL: 36 mg/dL — ABNORMAL LOW (ref >39)
LDL Chol Calc (NIH): 70 mg/dL (ref 0–99)
Triglycerides: 224 mg/dL — ABNORMAL HIGH (ref 0–149)
VLDL Cholesterol Cal: 37 mg/dL (ref 5–40)

## 2023-11-08 ENCOUNTER — Other Ambulatory Visit: Payer: Self-pay | Admitting: Nurse Practitioner

## 2023-11-08 DIAGNOSIS — G629 Polyneuropathy, unspecified: Secondary | ICD-10-CM

## 2023-11-17 ENCOUNTER — Ambulatory Visit: Payer: Medicare HMO | Admitting: Nurse Practitioner

## 2023-11-21 ENCOUNTER — Other Ambulatory Visit: Payer: Self-pay | Admitting: Nurse Practitioner

## 2023-11-21 DIAGNOSIS — E785 Hyperlipidemia, unspecified: Secondary | ICD-10-CM

## 2023-12-17 ENCOUNTER — Other Ambulatory Visit: Payer: Self-pay | Admitting: Nurse Practitioner

## 2023-12-17 DIAGNOSIS — E785 Hyperlipidemia, unspecified: Secondary | ICD-10-CM

## 2023-12-21 ENCOUNTER — Other Ambulatory Visit: Payer: Self-pay | Admitting: Nurse Practitioner

## 2023-12-21 NOTE — Telephone Encounter (Signed)
 MMM pt NTBS 30-d given 11/24/23

## 2023-12-21 NOTE — Telephone Encounter (Signed)
 Called pt to make appt, spoke with pt son and stated he would call back to make appt

## 2024-01-09 ENCOUNTER — Other Ambulatory Visit: Payer: Self-pay | Admitting: Nurse Practitioner

## 2024-01-09 DIAGNOSIS — I10 Essential (primary) hypertension: Secondary | ICD-10-CM

## 2024-01-09 DIAGNOSIS — G629 Polyneuropathy, unspecified: Secondary | ICD-10-CM

## 2024-01-09 DIAGNOSIS — E785 Hyperlipidemia, unspecified: Secondary | ICD-10-CM

## 2024-01-09 DIAGNOSIS — R600 Localized edema: Secondary | ICD-10-CM

## 2024-01-11 NOTE — Telephone Encounter (Signed)
 Last OV 05/14/2023. Last RF 05/14/2023. Next OV not scheduled-pt has cancelled last 2 appts. Ok to refill?

## 2024-01-25 ENCOUNTER — Encounter: Payer: Self-pay | Admitting: Nurse Practitioner

## 2024-01-25 ENCOUNTER — Other Ambulatory Visit: Payer: Self-pay | Admitting: Nurse Practitioner

## 2024-01-25 DIAGNOSIS — E785 Hyperlipidemia, unspecified: Secondary | ICD-10-CM

## 2024-01-25 NOTE — Telephone Encounter (Signed)
 MMM pt NTBS 30-d given 01/11/24

## 2024-01-25 NOTE — Telephone Encounter (Signed)
 Letter sent.

## 2024-03-14 ENCOUNTER — Encounter: Payer: Self-pay | Admitting: Nurse Practitioner

## 2024-03-14 ENCOUNTER — Other Ambulatory Visit: Payer: Self-pay | Admitting: Nurse Practitioner

## 2024-03-14 DIAGNOSIS — E785 Hyperlipidemia, unspecified: Secondary | ICD-10-CM

## 2024-03-14 NOTE — Telephone Encounter (Signed)
 Letter sent.

## 2024-03-14 NOTE — Telephone Encounter (Signed)
 MMM pt NTBS 30-d given 01/11/24

## 2024-04-08 ENCOUNTER — Encounter: Payer: Self-pay | Admitting: Nurse Practitioner

## 2024-04-08 ENCOUNTER — Ambulatory Visit (INDEPENDENT_AMBULATORY_CARE_PROVIDER_SITE_OTHER): Admitting: Nurse Practitioner

## 2024-04-08 VITALS — BP 149/73 | HR 71 | Temp 97.2°F | Ht 70.0 in | Wt 214.0 lb

## 2024-04-08 DIAGNOSIS — E785 Hyperlipidemia, unspecified: Secondary | ICD-10-CM

## 2024-04-08 DIAGNOSIS — R600 Localized edema: Secondary | ICD-10-CM | POA: Diagnosis not present

## 2024-04-08 DIAGNOSIS — G629 Polyneuropathy, unspecified: Secondary | ICD-10-CM | POA: Diagnosis not present

## 2024-04-08 DIAGNOSIS — I10 Essential (primary) hypertension: Secondary | ICD-10-CM

## 2024-04-08 DIAGNOSIS — Z0000 Encounter for general adult medical examination without abnormal findings: Secondary | ICD-10-CM

## 2024-04-08 DIAGNOSIS — Z6830 Body mass index (BMI) 30.0-30.9, adult: Secondary | ICD-10-CM

## 2024-04-08 DIAGNOSIS — J410 Simple chronic bronchitis: Secondary | ICD-10-CM

## 2024-04-08 DIAGNOSIS — Z0001 Encounter for general adult medical examination with abnormal findings: Secondary | ICD-10-CM

## 2024-04-08 NOTE — Progress Notes (Signed)
 Subjective:    Patient ID: Neil Smith, male    DOB: 1940/12/21, 83 y.o.   MRN: [id number]   Chief Complaint: annual  physical    HPI:  Neil Smith is a 83 y.o. who identifies as a male who was assigned male at birth.   Social history: Lives with: wife Work history: retired   Water engineer in today for follow up of the following chronic medical issues:  1. Primary hypertension No c/o chest pain, sob or headache. Does not check blood pressure at home. BP Readings from Last 3 Encounters:  05/14/23 (!) 141/59  07/07/22 (!) 126/55  01/27/22 (!) 154/61     2. Hyperlipidemia with target LDL less than 100 Does not watch diet and does no dedicated exercise Lab Results  Component Value Date   CHOL 143 05/14/2023   HDL 36 (L) 05/14/2023   LDLCALC 70 05/14/2023   TRIG 224 (H) 05/14/2023   CHOLHDL 4.0 05/14/2023     3. Simple chronic bronchitis (HCC) Is on advair daily and is doing well. Is on oxygen  24/7 at 2L via nasal cannula.  4. Neuropathy Has numbness in bil feet  5. Peripheral edema Has some daily edema  6. BMI 29.0-29.9,adult Weight is up 6 lbs  Wt Readings from Last 3 Encounters:  04/08/24 214 lb (97.1 kg)  05/14/23 208 lb (94.3 kg)  12/31/22 218 lb (98.9 kg)   BMI Readings from Last 3 Encounters:  04/08/24 30.71 kg/m  05/14/23 29.84 kg/m  12/31/22 31.28 kg/m      New complaints: None today  No Known Allergies Outpatient Encounter Medications as of 04/08/2024  Medication Sig   albuterol  (VENTOLIN  HFA) 108 (90 Base) MCG/ACT inhaler INHALE 2 PUFFS INTO LUNGS EVERY 6 HOURS AS NEED FOR WHEEZE OR SHORT BREATH (NEED SEEN BEFORE REFILL)   atorvastatin  (LIPITOR) 20 MG tablet TAKE 1 TABLET BY MOUTH DAILY. **NEEDS TO BE SEEN BEFORE NEXT REFILL**   fluticasone  (FLONASE ) 50 MCG/ACT nasal spray Place 2 sprays into both nostrils daily. (Patient not taking: Reported on 05/14/2023)   fluticasone -salmeterol (ADVAIR) 250-50 MCG/ACT AEPB Inhale 1 puff into  the lungs in the morning and at bedtime.   furosemide  (LASIX ) 20 MG tablet TAKE 1 TABLET BY MOUTH EVERY DAY   gabapentin  (NEURONTIN ) 300 MG capsule TAKE 1 CAPSULE BY MOUTH EVERYDAY AT BEDTIME   lisinopril  (ZESTRIL ) 40 MG tablet TAKE 1 TABLET BY MOUTH EVERY DAY   No facility-administered encounter medications on file as of 04/08/2024.    No past surgical history on file.  Family History  Problem Relation Age of Onset   Dementia Mother 16   Heart attack Brother    Hyperlipidemia Son    Heart attack Brother    Alcohol abuse Brother    Cancer Brother 48       Lung   Hyperlipidemia Son       Controlled substance contract: n/a     Review of Systems  Constitutional:  Negative for diaphoresis.  Eyes:  Negative for pain.  Respiratory:  Negative for shortness of breath.   Cardiovascular:  Negative for chest pain, palpitations and leg swelling.  Gastrointestinal:  Negative for abdominal pain.  Endocrine: Negative for polydipsia.  Skin:  Negative for rash.  Neurological:  Negative for dizziness, weakness and headaches.  Hematological:  Does not bruise/bleed easily.  All other systems reviewed and are negative.      Objective:   Physical Exam Vitals and nursing note reviewed.  Constitutional:  Appearance: Normal appearance. He is well-developed.  HENT:     Head: Normocephalic.     Nose: Nose normal.     Mouth/Throat:     Mouth: Mucous membranes are moist.     Pharynx: Oropharynx is clear.   Eyes:     Pupils: Pupils are equal, round, and reactive to light.   Neck:     Thyroid: No thyroid mass or thyromegaly.     Vascular: No carotid bruit or JVD.     Trachea: Phonation normal.   Cardiovascular:     Rate and Rhythm: Normal rate and regular rhythm.  Pulmonary:     Effort: Pulmonary effort is normal. No respiratory distress.     Breath sounds: Normal breath sounds.  Abdominal:     General: Bowel sounds are normal.     Palpations: Abdomen is soft.      Tenderness: There is no abdominal tenderness.   Musculoskeletal:        General: Normal range of motion.     Cervical back: Normal range of motion and neck supple.  Lymphadenopathy:     Cervical: No cervical adenopathy.   Skin:    General: Skin is warm and dry.   Neurological:     Mental Status: He is alert and oriented to person, place, and time.   Psychiatric:        Behavior: Behavior normal.        Thought Content: Thought content normal.        Judgment: Judgment normal.     BP (!) 149/73   Pulse 71   Temp (!) 97.2 F (36.2 C) (Temporal)   Ht 5' 10 (1.778 m)   Wt 214 lb (97.1 kg)   SpO2 95% Comment: 3 liters O2  BMI 30.71 kg/m       Assessment & Plan:  Neil Smith comes in today with chief complaint of No chief complaint on file.   Diagnosis and orders addressed:  1. Primary hypertension Low sodium diet - lisinopril  (ZESTRIL ) 40 MG tablet; Take 1 tablet (40 mg total) by mouth daily.  Dispense: 90 tablet; Refill: 1 - CBC with Differential/Platelet - CMP14+EGFR  2. Hyperlipidemia with target LDL less than 100 Low fat diet - atorvastatin  (LIPITOR) 20 MG tablet; Take 1 tablet (20 mg total) by mouth daily.  Dispense: 90 tablet; Refill: 1 - Lipid panel  3. Simple chronic bronchitis (HCC) Continue oxygen  - fluticasone -salmeterol (ADVAIR) 250-50 MCG/ACT AEPB; Inhale 1 puff into the lungs in the morning and at bedtime.  Dispense: 60 each; Refill: 5  4. Neuropathy - gabapentin  (NEURONTIN ) 300 MG capsule; Take 1 capsule (300 mg total) by mouth at bedtime.  Dispense: 90 capsule; Refill: 1  5. Peripheral edema Elevate legs when sitting - furosemide  (LASIX ) 20 MG tablet; Take 1 tablet (20 mg total) by mouth daily.  Dispense: 90 tablet; Refill: 1  6. BMI 30.0-30.9,adult Discussed diet and exercise for person with BMI >25 Will recheck weight in 3-6 months    Labs pending Health Maintenance reviewed Diet and exercise encouraged  Follow up plan: 6  Months    Mary-Margaret Gaylyn Keas, FNP

## 2024-04-08 NOTE — Patient Instructions (Signed)
 Fall Prevention in the Home, Adult Falls can cause injuries and can happen to people of all ages. There are many things you can do to make your home safer and to help prevent falls. What actions can I take to prevent falls? General information Use good lighting in all rooms. Make sure to: Replace any light bulbs that burn out. Turn on the lights in dark areas and use night-lights. Keep items that you use often in easy-to-reach places. Lower the shelves around your home if needed. Move furniture so that there are clear paths around it. Do not use throw rugs or other things on the floor that can make you trip. If any of your floors are uneven, fix them. Add color or contrast paint or tape to clearly mark and help you see: Grab bars or handrails. First and last steps of staircases. Where the edge of each step is. If you use a ladder or stepladder: Make sure that it is fully opened. Do not climb a closed ladder. Make sure the sides of the ladder are locked in place. Have someone hold the ladder while you use it. Know where your pets are as you move through your home. What can I do in the bathroom?     Keep the floor dry. Clean up any water on the floor right away. Remove soap buildup in the bathtub or shower. Buildup makes bathtubs and showers slippery. Use non-skid mats or decals on the floor of the bathtub or shower. Attach bath mats securely with double-sided, non-slip rug tape. If you need to sit down in the shower, use a non-slip stool. Install grab bars by the toilet and in the bathtub and shower. Do not use towel bars as grab bars. What can I do in the bedroom? Make sure that you have a light by your bed that is easy to reach. Do not use any sheets or blankets on your bed that hang to the floor. Have a firm chair or bench with side arms that you can use for support when you get dressed. What can I do in the kitchen? Clean up any spills right away. If you need to reach something  above you, use a step stool with a grab bar. Keep electrical cords out of the way. Do not use floor polish or wax that makes floors slippery. What can I do with my stairs? Do not leave anything on the stairs. Make sure that you have a light switch at the top and the bottom of the stairs. Make sure that there are handrails on both sides of the stairs. Fix handrails that are broken or loose. Install non-slip stair treads on all your stairs if they do not have carpet. Avoid having throw rugs at the top or bottom of the stairs. Choose a carpet that does not hide the edge of the steps on the stairs. Make sure that the carpet is firmly attached to the stairs. Fix carpet that is loose or worn. What can I do on the outside of my home? Use bright outdoor lighting. Fix the edges of walkways and driveways and fix any cracks. Clear paths of anything that can make you trip, such as tools or rocks. Add color or contrast paint or tape to clearly mark and help you see anything that might make you trip as you walk through a door, such as a raised step or threshold. Trim any bushes or trees on paths to your home. Check to see if handrails are loose  or broken and that both sides of all steps have handrails. Install guardrails along the edges of any raised decks and porches. Have leaves, snow, or ice cleared regularly. Use sand, salt, or ice melter on paths if you live where there is ice and snow during the winter. Clean up any spills in your garage right away. This includes grease or oil spills. What other actions can I take? Review your medicines with your doctor. Some medicines can cause dizziness or changes in blood pressure, which increase your risk of falling. Wear shoes that: Have a low heel. Do not wear high heels. Have rubber bottoms and are closed at the toe. Feel good on your feet and fit well. Use tools that help you move around if needed. These include: Canes. Walkers. Scooters. Crutches. Ask  your doctor what else you can do to help prevent falls. This may include seeing a physical therapist to learn to do exercises to move better and get stronger. Where to find more information Centers for Disease Control and Prevention, [employer]: TonerPromos.no [employer] on Aging: BaseRingTones.pl National Institute on Aging: BaseRingTones.pl Contact a doctor if: You are afraid of falling at home. You feel weak, drowsy, or dizzy at home. You fall at home. Get help right away if you: Lose consciousness or have trouble moving after a fall. Have a fall that causes a head injury. These symptoms may be an emergency. Get help right away. Call 911. Do not wait to see if the symptoms will go away. Do not drive yourself to the hospital. This information is not intended to replace advice given to you by your health care provider. Make sure you discuss any questions you have with your health care provider. Document Revised: 06/16/2022 Document Reviewed: 06/16/2022 Elsevier Patient Education  2024 [employer].

## 2024-04-09 LAB — CMP14+EGFR
ALT: 15 IU/L (ref 0–44)
AST: 16 IU/L (ref 0–40)
Albumin: 4.5 g/dL (ref 3.7–4.7)
Alkaline Phosphatase: 72 IU/L (ref 44–121)
BUN/Creatinine Ratio: 12 (ref 10–24)
BUN: 19 mg/dL (ref 8–27)
Bilirubin Total: 0.4 mg/dL (ref 0.0–1.2)
CO2: 21 mmol/L (ref 20–29)
Calcium: 9.2 mg/dL (ref 8.6–10.2)
Chloride: 106 mmol/L (ref 96–106)
Creatinine, Ser: 1.59 mg/dL — ABNORMAL HIGH (ref 0.76–1.27)
Globulin, Total: 1.9 g/dL (ref 1.5–4.5)
Glucose: 94 mg/dL (ref 70–99)
Potassium: 4.8 mmol/L (ref 3.5–5.2)
Sodium: 141 mmol/L (ref 134–144)
Total Protein: 6.4 g/dL (ref 6.0–8.5)
eGFR: 43 mL/min/{1.73_m2} — ABNORMAL LOW (ref >59)

## 2024-04-09 LAB — CBC WITH DIFFERENTIAL/PLATELET
Basophils Absolute: 0.1 10*3/uL (ref 0.0–0.2)
Basos: 1 %
EOS (ABSOLUTE): 0.1 10*3/uL (ref 0.0–0.4)
Eos: 2 %
Hematocrit: 40.9 % (ref 37.5–51.0)
Hemoglobin: 13.6 g/dL (ref 13.0–17.7)
Immature Grans (Abs): 0 10*3/uL (ref 0.0–0.1)
Immature Granulocytes: 0 %
Lymphocytes Absolute: 1.7 10*3/uL (ref 0.7–3.1)
Lymphs: 25 %
MCH: 31.1 pg (ref 26.6–33.0)
MCHC: 33.3 g/dL (ref 31.5–35.7)
MCV: 94 fL (ref 79–97)
Monocytes Absolute: 0.5 10*3/uL (ref 0.1–0.9)
Monocytes: 7 %
Neutrophils Absolute: 4.4 10*3/uL (ref 1.4–7.0)
Neutrophils: 65 %
Platelets: 239 10*3/uL (ref 150–450)
RBC: 4.37 x10E6/uL (ref 4.14–5.80)
RDW: 12.7 % (ref 11.6–15.4)
WBC: 6.8 10*3/uL (ref 3.4–10.8)

## 2024-04-09 LAB — LIPID PANEL
Chol/HDL Ratio: 4.1 ratio (ref 0.0–5.0)
Cholesterol, Total: 126 mg/dL (ref 100–199)
HDL: 31 mg/dL — ABNORMAL LOW (ref >39)
LDL Chol Calc (NIH): 64 mg/dL (ref 0–99)
Triglycerides: 181 mg/dL — ABNORMAL HIGH (ref 0–149)
VLDL Cholesterol Cal: 31 mg/dL (ref 5–40)

## 2024-04-11 ENCOUNTER — Ambulatory Visit: Payer: Self-pay | Admitting: Nurse Practitioner

## 2024-04-24 ENCOUNTER — Other Ambulatory Visit: Payer: Self-pay | Admitting: Nurse Practitioner

## 2024-04-24 DIAGNOSIS — G629 Polyneuropathy, unspecified: Secondary | ICD-10-CM

## 2024-04-24 DIAGNOSIS — E785 Hyperlipidemia, unspecified: Secondary | ICD-10-CM

## 2024-08-22 ENCOUNTER — Other Ambulatory Visit: Payer: Self-pay | Admitting: Nurse Practitioner

## 2024-08-22 DIAGNOSIS — I10 Essential (primary) hypertension: Secondary | ICD-10-CM

## 2024-08-22 DIAGNOSIS — R600 Localized edema: Secondary | ICD-10-CM

## 2024-10-04 ENCOUNTER — Ambulatory Visit: Payer: Self-pay | Admitting: Nurse Practitioner

## 2024-10-17 ENCOUNTER — Telehealth: Payer: Self-pay | Admitting: Pharmacist

## 2024-10-17 DIAGNOSIS — E785 Hyperlipidemia, unspecified: Secondary | ICD-10-CM

## 2024-10-17 MED ORDER — ATORVASTATIN CALCIUM 20 MG PO TABS: 20.0000 mg | ORAL_TABLET | Freq: Every day | ORAL | 1 refills | Status: AC

## 2024-10-17 MED ORDER — ATORVASTATIN CALCIUM 20 MG PO TABS: 20.0000 mg | ORAL_TABLET | Freq: Every day | ORAL | 1 refills | Status: DC

## 2024-10-17 NOTE — Telephone Encounter (Signed)
" ° °  This patient is appearing on a report for being at risk of failing the adherence measure for cholesterol (statin) medications this calendar year.   Medication: atorvastatin  Last fill date: 07/24/24 for 90 day supply  Contacted pharmacy to facilitate refills. and Will collaborate with provider to facilitate refill needs.   Dowell Hoon Dattero Senita Corredor, PharmD, BCACP, CPP Clinical Pharmacist, [facility]  "

## 2024-10-31 ENCOUNTER — Ambulatory Visit: Admitting: Nurse Practitioner

## 2024-10-31 VITALS — BP 132/76 | HR 75 | Temp 97.4°F | Ht 70.0 in | Wt 208.0 lb

## 2024-10-31 DIAGNOSIS — J410 Simple chronic bronchitis: Secondary | ICD-10-CM

## 2024-10-31 DIAGNOSIS — E785 Hyperlipidemia, unspecified: Secondary | ICD-10-CM

## 2024-10-31 DIAGNOSIS — I10 Essential (primary) hypertension: Secondary | ICD-10-CM

## 2024-10-31 DIAGNOSIS — R600 Localized edema: Secondary | ICD-10-CM

## 2024-10-31 DIAGNOSIS — Z6834 Body mass index (BMI) 34.0-34.9, adult: Secondary | ICD-10-CM

## 2024-10-31 DIAGNOSIS — G629 Polyneuropathy, unspecified: Secondary | ICD-10-CM

## 2024-10-31 LAB — LIPID PANEL
Chol/HDL Ratio: 3.6 ratio (ref 0.0–5.0)
Cholesterol, Total: 118 mg/dL (ref 100–199)
HDL: 33 mg/dL — ABNORMAL LOW
LDL Chol Calc (NIH): 67 mg/dL (ref 0–99)
Triglycerides: 92 mg/dL (ref 0–149)
VLDL Cholesterol Cal: 18 mg/dL (ref 5–40)

## 2024-10-31 LAB — CBC WITH DIFFERENTIAL/PLATELET
Basophils Absolute: 0 x10E3/uL (ref 0.0–0.2)
Basos: 0 %
EOS (ABSOLUTE): 0.1 x10E3/uL (ref 0.0–0.4)
Eos: 1 %
Hematocrit: 41.8 % (ref 37.5–51.0)
Hemoglobin: 13.6 g/dL (ref 13.0–17.7)
Immature Grans (Abs): 0 x10E3/uL (ref 0.0–0.1)
Immature Granulocytes: 0 %
Lymphocytes Absolute: 1.1 x10E3/uL (ref 0.7–3.1)
Lymphs: 12 %
MCH: 29.6 pg (ref 26.6–33.0)
MCHC: 32.5 g/dL (ref 31.5–35.7)
MCV: 91 fL (ref 79–97)
Monocytes Absolute: 0.4 x10E3/uL (ref 0.1–0.9)
Monocytes: 4 %
Neutrophils Absolute: 7.9 x10E3/uL — ABNORMAL HIGH (ref 1.4–7.0)
Neutrophils: 83 %
Platelets: 329 x10E3/uL (ref 150–450)
RBC: 4.6 x10E6/uL (ref 4.14–5.80)
RDW: 12.6 % (ref 11.6–15.4)
WBC: 9.6 x10E3/uL (ref 3.4–10.8)

## 2024-10-31 LAB — CMP14+EGFR
ALT: 17 IU/L (ref 0–44)
AST: 14 IU/L (ref 0–40)
Albumin: 4.1 g/dL (ref 3.7–4.7)
Alkaline Phosphatase: 68 IU/L (ref 48–129)
BUN/Creatinine Ratio: 11 (ref 10–24)
BUN: 13 mg/dL (ref 8–27)
Bilirubin Total: 0.5 mg/dL (ref 0.0–1.2)
CO2: 23 mmol/L (ref 20–29)
Calcium: 9 mg/dL (ref 8.6–10.2)
Chloride: 105 mmol/L (ref 96–106)
Creatinine, Ser: 1.14 mg/dL (ref 0.76–1.27)
Globulin, Total: 2.4 g/dL (ref 1.5–4.5)
Glucose: 117 mg/dL — ABNORMAL HIGH (ref 70–99)
Potassium: 4.9 mmol/L (ref 3.5–5.2)
Sodium: 142 mmol/L (ref 134–144)
Total Protein: 6.5 g/dL (ref 6.0–8.5)
eGFR: 64 mL/min/1.73

## 2024-10-31 MED ORDER — GABAPENTIN 300 MG PO CAPS: 300.0000 mg | ORAL_CAPSULE | Freq: Every day | ORAL | 1 refills | Status: AC

## 2024-10-31 MED ORDER — FUROSEMIDE 20 MG PO TABS: 20.0000 mg | ORAL_TABLET | Freq: Every day | ORAL | 1 refills | Status: AC

## 2024-10-31 MED ORDER — LISINOPRIL 40 MG PO TABS: 40.0000 mg | ORAL_TABLET | Freq: Every day | ORAL | 1 refills | Status: AC

## 2024-10-31 MED ORDER — FLUTICASONE-SALMETEROL 250-50 MCG/ACT IN AEPB: 1.0000 | INHALATION_SPRAY | Freq: Two times a day (BID) | RESPIRATORY_TRACT | 5 refills | Status: AC

## 2024-10-31 NOTE — Progress Notes (Signed)
 "  Subjective:    Patient ID: Neil Smith, male    DOB: August 09, 1941, 84 y.o.   MRN: [id number]   Chief Complaint: medical management of chronic issues     HPI:  Neil Smith is a 84 y.o. who identifies as a male who was assigned male at birth.   Social history: Lives with: wife Work history: retired   Water Engineer in today for follow up of the following chronic medical issues:  1. Primary hypertension No c/o chest pain, sob or headache. Does not check blood pressure at home. BP Readings from Last 3 Encounters:  04/08/24 (!) 149/73  05/14/23 (!) 141/59  07/07/22 (!) 126/55     2. Hyperlipidemia with target LDL less than 100 Does not watch diet and does no dedicated exercise Lab Results  Component Value Date   CHOL 126 04/08/2024   HDL 31 (L) 04/08/2024   LDLCALC 64 04/08/2024   TRIG 181 (H) 04/08/2024   CHOLHDL 4.1 04/08/2024     3. Simple chronic bronchitis (HCC) Is on advair daily and is doing well. Is on oxygen  24/7 at 2L via nasal cannula.  4. Neuropathy Has numbness in bil feet  5. Peripheral edema Has some daily edema  6. BMI 34.0-34.9,adult Weight is down 6lbs  Wt Readings from Last 3 Encounters:  10/31/24 208 lb (94.3 kg)  04/08/24 214 lb (97.1 kg)  05/14/23 208 lb (94.3 kg)   BMI Readings from Last 3 Encounters:  10/31/24 29.84 kg/m  04/08/24 30.71 kg/m  05/14/23 29.84 kg/m      New complaints: Had flu last week but is much better.  No Known Allergies Outpatient Encounter Medications as of 10/31/2024  Medication Sig   albuterol  (VENTOLIN  HFA) 108 (90 Base) MCG/ACT inhaler INHALE 2 PUFFS INTO LUNGS EVERY 6 HOURS AS NEED FOR WHEEZE OR SHORT BREATH (NEED SEEN BEFORE REFILL)   atorvastatin  (LIPITOR) 20 MG tablet Take 1 tablet (20 mg total) by mouth daily.   fluticasone  (FLONASE ) 50 MCG/ACT nasal spray Place 2 sprays into both nostrils daily.   fluticasone -salmeterol (ADVAIR) 250-50 MCG/ACT AEPB Inhale 1 puff into the lungs in the  morning and at bedtime.   furosemide  (LASIX ) 20 MG tablet TAKE 1 TABLET BY MOUTH EVERY DAY   gabapentin  (NEURONTIN ) 300 MG capsule TAKE 1 CAPSULE BY MOUTH EVERYDAY AT BEDTIME   lisinopril  (ZESTRIL ) 40 MG tablet TAKE 1 TABLET BY MOUTH EVERY DAY   loratadine (CLARITIN) 10 MG tablet Take 10 mg by mouth daily.   No facility-administered encounter medications on file as of 10/31/2024.    No past surgical history on file.  Family History  Problem Relation Age of Onset   Dementia Mother 18   Heart attack Brother    Hyperlipidemia Son    Heart attack Brother    Alcohol abuse Brother    Cancer Brother 48       Lung   Hyperlipidemia Son       Controlled substance contract: n/a     Review of Systems  Constitutional:  Negative for diaphoresis.  Eyes:  Negative for pain.  Respiratory:  Negative for shortness of breath.   Cardiovascular:  Negative for chest pain, palpitations and leg swelling.  Gastrointestinal:  Negative for abdominal pain.  Endocrine: Negative for polydipsia.  Skin:  Negative for rash.  Neurological:  Negative for dizziness, weakness and headaches.  Hematological:  Does not bruise/bleed easily.  All other systems reviewed and are negative.      Objective:  Physical Exam Vitals and nursing note reviewed.  Constitutional:      Appearance: Normal appearance. He is well-developed.  HENT:     Head: Normocephalic.     Nose: Nose normal.     Mouth/Throat:     Mouth: Mucous membranes are moist.     Pharynx: Oropharynx is clear.  Eyes:     Pupils: Pupils are equal, round, and reactive to light.  Neck:     Thyroid: No thyroid mass or thyromegaly.     Vascular: No carotid bruit or JVD.     Trachea: Phonation normal.  Cardiovascular:     Rate and Rhythm: Normal rate and regular rhythm.  Pulmonary:     Effort: Pulmonary effort is normal. No respiratory distress.     Breath sounds: Normal breath sounds.  Abdominal:     General: Bowel sounds are normal.      Palpations: Abdomen is soft.     Tenderness: There is no abdominal tenderness.  Musculoskeletal:        General: Normal range of motion.     Cervical back: Normal range of motion and neck supple.  Lymphadenopathy:     Cervical: No cervical adenopathy.  Skin:    General: Skin is warm and dry.  Neurological:     Mental Status: He is alert and oriented to person, place, and time.  Psychiatric:        Behavior: Behavior normal.        Thought Content: Thought content normal.        Judgment: Judgment normal.   BP 132/76   Pulse 75   Temp (!) 97.4 F (36.3 C) (Temporal)   Ht 5' 10 (1.778 m)   Wt 208 lb (94.3 kg)   SpO2 94% Comment: 3 literes O2  BMI 29.84 kg/m       Assessment & Plan:  Neil Smith comes in today with chief complaint of medical management of chronic issues    Diagnosis and orders addressed:  1. Primary hypertension Low sodium diet - lisinopril  (ZESTRIL ) 40 MG tablet; Take 1 tablet (40 mg total) by mouth daily.  Dispense: 90 tablet; Refill: 1 - CBC with Differential/Platelet - CMP14+EGFR  2. Hyperlipidemia with target LDL less than 100 Low fat diet - atorvastatin  (LIPITOR) 20 MG tablet; Take 1 tablet (20 mg total) by mouth daily.  Dispense: 90 tablet; Refill: 1 - Lipid panel  3. Simple chronic bronchitis (HCC) Continue oxygen  - fluticasone -salmeterol (ADVAIR) 250-50 MCG/ACT AEPB; Inhale 1 puff into the lungs in the morning and at bedtime.  Dispense: 60 each; Refill: 5  4. Neuropathy - gabapentin  (NEURONTIN ) 300 MG capsule; Take 1 capsule (300 mg total) by mouth at bedtime.  Dispense: 90 capsule; Refill: 1  5. Peripheral edema Elevate legs when sitting - furosemide  (LASIX ) 20 MG tablet; Take 1 tablet (20 mg total) by mouth daily.  Dispense: 90 tablet; Refill: 1  6. BMI 34.0-34.9,adult Discussed diet and exercise for person with BMI >25 Will recheck weight in 3-6 months    Labs pending Health Maintenance reviewed Diet and exercise  encouraged  Follow up plan: 6 Months    Mary-Margaret Gladis, FNP   "

## 2024-10-31 NOTE — Patient Instructions (Signed)
 Fall Prevention in the Home, Adult Falls can cause injuries and can happen to people of all ages. There are many things you can do to make your home safer and to help prevent falls. What actions can I take to prevent falls? General information Use good lighting in all rooms. Make sure to: Replace any light bulbs that burn out. Turn on the lights in dark areas and use night-lights. Keep items that you use often in easy-to-reach places. Lower the shelves around your home if needed. Move furniture so that there are clear paths around it. Do not use throw rugs or other things on the floor that can make you trip. If any of your floors are uneven, fix them. Add color or contrast paint or tape to clearly mark and help you see: Grab bars or handrails. First and last steps of staircases. Where the edge of each step is. If you use a ladder or stepladder: Make sure that it is fully opened. Do not climb a closed ladder. Make sure the sides of the ladder are locked in place. Have someone hold the ladder while you use it. Know where your pets are as you move through your home. What can I do in the bathroom?     Keep the floor dry. Clean up any water on the floor right away. Remove soap buildup in the bathtub or shower. Buildup makes bathtubs and showers slippery. Use non-skid mats or decals on the floor of the bathtub or shower. Attach bath mats securely with double-sided, non-slip rug tape. If you need to sit down in the shower, use a non-slip stool. Install grab bars by the toilet and in the bathtub and shower. Do not use towel bars as grab bars. What can I do in the bedroom? Make sure that you have a light by your bed that is easy to reach. Do not use any sheets or blankets on your bed that hang to the floor. Have a firm chair or bench with side arms that you can use for support when you get dressed. What can I do in the kitchen? Clean up any spills right away. If you need to reach something  above you, use a step stool with a grab bar. Keep electrical cords out of the way. Do not use floor polish or wax that makes floors slippery. What can I do with my stairs? Do not leave anything on the stairs. Make sure that you have a light switch at the top and the bottom of the stairs. Make sure that there are handrails on both sides of the stairs. Fix handrails that are broken or loose. Install non-slip stair treads on all your stairs if they do not have carpet. Avoid having throw rugs at the top or bottom of the stairs. Choose a carpet that does not hide the edge of the steps on the stairs. Make sure that the carpet is firmly attached to the stairs. Fix carpet that is loose or worn. What can I do on the outside of my home? Use bright outdoor lighting. Fix the edges of walkways and driveways and fix any cracks. Clear paths of anything that can make you trip, such as tools or rocks. Add color or contrast paint or tape to clearly mark and help you see anything that might make you trip as you walk through a door, such as a raised step or threshold. Trim any bushes or trees on paths to your home. Check to see if handrails are loose  or broken and that both sides of all steps have handrails. Install guardrails along the edges of any raised decks and porches. Have leaves, snow, or ice cleared regularly. Use sand, salt, or ice melter on paths if you live where there is ice and snow during the winter. Clean up any spills in your garage right away. This includes grease or oil spills. What other actions can I take? Review your medicines with your doctor. Some medicines can cause dizziness or changes in blood pressure, which increase your risk of falling. Wear shoes that: Have a low heel. Do not wear high heels. Have rubber bottoms and are closed at the toe. Feel good on your feet and fit well. Use tools that help you move around if needed. These include: Canes. Walkers. Scooters. Crutches. Ask  your doctor what else you can do to help prevent falls. This may include seeing a physical therapist to learn to do exercises to move better and get stronger. Where to find more information Centers for Disease Control and Prevention, [employer]: TonerPromos.no [employer] on Aging: BaseRingTones.pl National Institute on Aging: BaseRingTones.pl Contact a doctor if: You are afraid of falling at home. You feel weak, drowsy, or dizzy at home. You fall at home. Get help right away if you: Lose consciousness or have trouble moving after a fall. Have a fall that causes a head injury. These symptoms may be an emergency. Get help right away. Call 911. Do not wait to see if the symptoms will go away. Do not drive yourself to the hospital. This information is not intended to replace advice given to you by your health care provider. Make sure you discuss any questions you have with your health care provider. Document Revised: 06/16/2022 Document Reviewed: 06/16/2022 Elsevier Patient Education  2024 [employer].

## 2024-10-31 NOTE — Addendum Note (Signed)
 Addended by: Siddalee Vanderheiden, MARY-MARGARET on: 10/31/2024 10:23 AM   Modules accepted: Orders

## 2024-11-01 ENCOUNTER — Ambulatory Visit: Payer: Self-pay | Admitting: Nurse Practitioner

## 2025-05-02 ENCOUNTER — Ambulatory Visit: Admitting: Nurse Practitioner
